# Patient Record
Sex: Male | Born: 1935 | Race: White | Hispanic: No | Marital: Married | Smoking: Former smoker
Health system: Southern US, Community
[De-identification: ages and names within clinical notes are randomized; demographics above are authoritative.]

## PROBLEM LIST (undated history)

## (undated) DIAGNOSIS — E785 Hyperlipidemia, unspecified: Secondary | ICD-10-CM

## (undated) DIAGNOSIS — I350 Nonrheumatic aortic (valve) stenosis: Secondary | ICD-10-CM

## (undated) DIAGNOSIS — Z8719 Personal history of other diseases of the digestive system: Secondary | ICD-10-CM

## (undated) DIAGNOSIS — R42 Dizziness and giddiness: Secondary | ICD-10-CM

## (undated) HISTORY — DX: Dizziness and giddiness: R42

## (undated) HISTORY — DX: Nonrheumatic aortic (valve) stenosis: I35.0

## (undated) HISTORY — DX: Hyperlipidemia, unspecified: E78.5

## (undated) HISTORY — DX: Personal history of other diseases of the digestive system: Z87.19

---

## 1998-12-17 ENCOUNTER — Ambulatory Visit (HOSPITAL_COMMUNITY): Admission: RE | Admit: 1998-12-17 | Discharge: 1998-12-17 | Payer: Self-pay | Admitting: *Deleted

## 2000-03-11 ENCOUNTER — Encounter: Payer: Self-pay | Admitting: *Deleted

## 2000-03-11 ENCOUNTER — Encounter: Admission: RE | Admit: 2000-03-11 | Discharge: 2000-03-11 | Payer: Self-pay | Admitting: *Deleted

## 2002-03-31 HISTORY — PX: CATARACT EXTRACTION: SUR2

## 2002-03-31 HISTORY — PX: COLONOSCOPY: SHX174

## 2003-01-25 ENCOUNTER — Ambulatory Visit (HOSPITAL_COMMUNITY): Admission: RE | Admit: 2003-01-25 | Discharge: 2003-01-25 | Payer: Self-pay | Admitting: *Deleted

## 2003-04-01 HISTORY — PX: APPENDECTOMY: SHX54

## 2003-11-10 ENCOUNTER — Inpatient Hospital Stay (HOSPITAL_COMMUNITY): Admission: EM | Admit: 2003-11-10 | Discharge: 2003-11-17 | Payer: Self-pay

## 2004-04-22 ENCOUNTER — Inpatient Hospital Stay (HOSPITAL_COMMUNITY): Admission: EM | Admit: 2004-04-22 | Discharge: 2004-04-24 | Payer: Self-pay | Admitting: Emergency Medicine

## 2005-04-18 ENCOUNTER — Encounter: Admission: RE | Admit: 2005-04-18 | Discharge: 2005-07-17 | Payer: Self-pay | Admitting: Family Medicine

## 2011-04-08 DIAGNOSIS — I10 Essential (primary) hypertension: Secondary | ICD-10-CM | POA: Diagnosis not present

## 2011-04-08 DIAGNOSIS — E119 Type 2 diabetes mellitus without complications: Secondary | ICD-10-CM | POA: Diagnosis not present

## 2011-07-15 DIAGNOSIS — H524 Presbyopia: Secondary | ICD-10-CM | POA: Diagnosis not present

## 2011-07-15 DIAGNOSIS — H52229 Regular astigmatism, unspecified eye: Secondary | ICD-10-CM | POA: Diagnosis not present

## 2011-07-15 DIAGNOSIS — H35379 Puckering of macula, unspecified eye: Secondary | ICD-10-CM | POA: Diagnosis not present

## 2011-07-15 DIAGNOSIS — H5200 Hypermetropia, unspecified eye: Secondary | ICD-10-CM | POA: Diagnosis not present

## 2011-08-24 ENCOUNTER — Encounter: Payer: Self-pay | Admitting: *Deleted

## 2011-10-07 ENCOUNTER — Telehealth: Payer: Self-pay | Admitting: Cardiology

## 2011-10-07 DIAGNOSIS — E7800 Pure hypercholesterolemia, unspecified: Secondary | ICD-10-CM | POA: Diagnosis not present

## 2011-10-07 DIAGNOSIS — G479 Sleep disorder, unspecified: Secondary | ICD-10-CM | POA: Diagnosis not present

## 2011-10-07 DIAGNOSIS — E119 Type 2 diabetes mellitus without complications: Secondary | ICD-10-CM | POA: Diagnosis not present

## 2011-10-07 DIAGNOSIS — I10 Essential (primary) hypertension: Secondary | ICD-10-CM | POA: Diagnosis not present

## 2011-10-07 DIAGNOSIS — R252 Cramp and spasm: Secondary | ICD-10-CM | POA: Diagnosis not present

## 2011-10-07 NOTE — Telephone Encounter (Signed)
Patient wanted to see  Dr. Patty Sermons prior to echo.  Scheduled a visit

## 2011-10-07 NOTE — Telephone Encounter (Signed)
Pt calling re fu appt, does he need echo first?

## 2011-10-10 ENCOUNTER — Encounter: Payer: Self-pay | Admitting: Cardiology

## 2011-10-10 ENCOUNTER — Ambulatory Visit (INDEPENDENT_AMBULATORY_CARE_PROVIDER_SITE_OTHER): Payer: Medicare Other | Admitting: Cardiology

## 2011-10-10 VITALS — BP 138/70 | HR 99 | Ht 63.5 in | Wt 150.0 lb

## 2011-10-10 DIAGNOSIS — I350 Nonrheumatic aortic (valve) stenosis: Secondary | ICD-10-CM | POA: Insufficient documentation

## 2011-10-10 DIAGNOSIS — R002 Palpitations: Secondary | ICD-10-CM | POA: Diagnosis not present

## 2011-10-10 DIAGNOSIS — I359 Nonrheumatic aortic valve disorder, unspecified: Secondary | ICD-10-CM | POA: Diagnosis not present

## 2011-10-10 NOTE — Assessment & Plan Note (Signed)
The patient has not been having any chest pain or shortness of breath.  No dizziness or syncope.  He still stays active mowing his grass and using a treadmill at home.  He has not been aware of any palpitations.

## 2011-10-10 NOTE — Patient Instructions (Signed)
Your physician has requested that you have an echocardiogram. Echocardiography is a painless test that uses sound waves to create images of your heart. It provides your doctor with information about the size and shape of your heart and how well your heart's chambers and valves are working. This procedure takes approximately one hour. There are no restrictions for this procedure.  Your physician recommends that you continue on your current medications as directed. Please refer to the Current Medication list given to you today.  Your physician wants you to follow-up in: 1 year. You will receive a reminder letter in the mail two months in advance. If you don't receive a letter, please call our office to schedule the follow-up appointment.  

## 2011-10-10 NOTE — Progress Notes (Signed)
Chad York Date of Birth:  Feb 09, 1928 [facility] [address] [place], [place]  [address] [phone]  Fax   [phone]  HPI: This pleasant 78 year old gentleman is seen for a scheduled followup office visit.  Normally we see him once a year but he missed last year.  He has a history of known valvular aortic stenosis.  His last echocardiogram was 07/28/08 and showed a peak instantaneous gradient of 26 and a mean gradient of 13 system with moderate calcific aortic stenosis. Current Outpatient Prescriptions  Medication Sig Dispense Refill  . amLODipine (NORVASC) 10 MG tablet Take 10 mg by mouth Daily.      Marland Kitchen atorvastatin (LIPITOR) 20 MG tablet Take 20 mg by mouth Daily.      . hydrochlorothiazide (HYDRODIURIL) 25 MG tablet Take 25 mg by mouth Daily.      . metFORMIN (GLUCOPHAGE) 500 MG tablet Take 500 mg by mouth Twice daily.      . Multiple Vitamin (MULTIVITAMIN) tablet Take 1 tablet by mouth daily.      . ramipril (ALTACE) 10 MG capsule Take 10 mg by mouth Twice daily.        Allergies  Allergen Reactions  . Septra (Sulfamethoxazole W-Trimethoprim)     rash    There is no problem list on file for this patient.   History  Smoking status  . Former Smoker  . Types: Cigarettes  . Quit date: 03/31/1985  Smokeless tobacco  . Not on file    History  Alcohol Use  . Yes    4-6 DRINKS WEEKLY     Family History  Problem Relation Age of Onset  . Heart disease Father   . Heart failure Father   . Heart disease Brother     CABG  . Stroke Brother   . Heart disease Brother     Review of Systems: The patient denies any heat or cold intolerance.  No weight gain or weight loss.  The patient denies headaches or blurry vision.  There is no cough or sputum production.  The patient denies dizziness.  There is no hematuria or hematochezia.  The patient denies any muscle aches or arthritis.  The patient denies any rash.  The patient denies frequent falling  or instability.  There is no history of depression or anxiety.  All other systems were reviewed and are negative.   Physical Exam: Filed Vitals:   10/10/11 1545  BP: 138/70  Pulse: 99   the general appearance reveals a well-developed elderly gentleman in no distress.Pupils equal and reactive.   Extraocular Movements are full.  There is no scleral icterus.  The mouth and pharynx are normal.  The neck is supple.  The carotids reveal no bruits.  The jugular venous pressure is normal.  The thyroid is not enlarged.  There is no lymphadenopathy.  The chest is clear to percussion and auscultation. There are no rales or rhonchi. Expansion of the chest is symmetrical.  Heart reveals grade 3/6 systolic ejection murmur at the base radiating to the neck.The abdomen is soft and nontender. Bowel sounds are normal. The liver and spleen are not enlarged. There Are no abdominal masses. There are no bruits.  The pedal pulses are good.  There is no phlebitis or edema.  There is no cyanosis or clubbing. Strength is normal and symmetrical in all extremities.  There is no lateralizing weakness.  There are no sensory deficits.  EKG today shows normal sinus rhythm at 99 per  minute and no ischemic changes and no changes of LVH   Assessment / Plan:  We will have the patient return soon for a two-dimensional echocardiogram to followup on his aortic stenosis. Recheck in one year for followup office visit.

## 2011-10-13 ENCOUNTER — Ambulatory Visit (HOSPITAL_COMMUNITY): Payer: Medicare Other | Attending: Cardiology

## 2011-10-13 DIAGNOSIS — R002 Palpitations: Secondary | ICD-10-CM | POA: Insufficient documentation

## 2011-10-13 DIAGNOSIS — I517 Cardiomegaly: Secondary | ICD-10-CM | POA: Diagnosis not present

## 2011-10-13 DIAGNOSIS — I359 Nonrheumatic aortic valve disorder, unspecified: Secondary | ICD-10-CM | POA: Insufficient documentation

## 2011-10-13 DIAGNOSIS — Z87891 Personal history of nicotine dependence: Secondary | ICD-10-CM | POA: Insufficient documentation

## 2011-10-13 DIAGNOSIS — I350 Nonrheumatic aortic (valve) stenosis: Secondary | ICD-10-CM

## 2011-10-13 NOTE — Progress Notes (Signed)
Echocardiogram performed.  

## 2011-10-22 ENCOUNTER — Telehealth: Payer: Self-pay | Admitting: *Deleted

## 2011-10-22 NOTE — Telephone Encounter (Signed)
Advised of results

## 2011-10-22 NOTE — Telephone Encounter (Signed)
Message copied by Burnell Blanks on Wed Oct 22, 2011  4:26 PM ------      Message from: Cassell Clement      Created: Mon Oct 20, 2011  9:27 AM       Please report.  The echocardiogram is stable.  The aortic stenosis is still mild.  Left ventricular function is good.  Send a copy to his primary care provider.

## 2011-11-17 DIAGNOSIS — K5900 Constipation, unspecified: Secondary | ICD-10-CM | POA: Diagnosis not present

## 2011-11-17 DIAGNOSIS — R1032 Left lower quadrant pain: Secondary | ICD-10-CM | POA: Diagnosis not present

## 2012-01-13 DIAGNOSIS — E11319 Type 2 diabetes mellitus with unspecified diabetic retinopathy without macular edema: Secondary | ICD-10-CM | POA: Diagnosis not present

## 2012-01-20 DIAGNOSIS — Z23 Encounter for immunization: Secondary | ICD-10-CM | POA: Diagnosis not present

## 2012-02-12 ENCOUNTER — Encounter: Payer: Self-pay | Admitting: Cardiology

## 2012-04-09 DIAGNOSIS — G479 Sleep disorder, unspecified: Secondary | ICD-10-CM | POA: Diagnosis not present

## 2012-04-09 DIAGNOSIS — K649 Unspecified hemorrhoids: Secondary | ICD-10-CM | POA: Diagnosis not present

## 2012-04-09 DIAGNOSIS — N400 Enlarged prostate without lower urinary tract symptoms: Secondary | ICD-10-CM | POA: Diagnosis not present

## 2012-04-09 DIAGNOSIS — E119 Type 2 diabetes mellitus without complications: Secondary | ICD-10-CM | POA: Diagnosis not present

## 2012-05-20 ENCOUNTER — Encounter: Payer: Self-pay | Admitting: Cardiology

## 2012-07-06 DIAGNOSIS — E1139 Type 2 diabetes mellitus with other diabetic ophthalmic complication: Secondary | ICD-10-CM | POA: Diagnosis not present

## 2012-07-06 DIAGNOSIS — H35379 Puckering of macula, unspecified eye: Secondary | ICD-10-CM | POA: Diagnosis not present

## 2012-08-25 DIAGNOSIS — R05 Cough: Secondary | ICD-10-CM | POA: Diagnosis not present

## 2012-10-12 DIAGNOSIS — Z23 Encounter for immunization: Secondary | ICD-10-CM | POA: Diagnosis not present

## 2012-10-12 DIAGNOSIS — E119 Type 2 diabetes mellitus without complications: Secondary | ICD-10-CM | POA: Diagnosis not present

## 2012-10-12 DIAGNOSIS — E7800 Pure hypercholesterolemia, unspecified: Secondary | ICD-10-CM | POA: Diagnosis not present

## 2012-10-12 DIAGNOSIS — G479 Sleep disorder, unspecified: Secondary | ICD-10-CM | POA: Diagnosis not present

## 2012-10-12 DIAGNOSIS — Z0000 Encounter for general adult medical examination without abnormal findings: Secondary | ICD-10-CM | POA: Diagnosis not present

## 2012-10-12 DIAGNOSIS — Z79899 Other long term (current) drug therapy: Secondary | ICD-10-CM | POA: Diagnosis not present

## 2012-10-12 DIAGNOSIS — I10 Essential (primary) hypertension: Secondary | ICD-10-CM | POA: Diagnosis not present

## 2012-10-28 ENCOUNTER — Telehealth: Payer: Self-pay | Admitting: Cardiology

## 2012-10-28 DIAGNOSIS — I350 Nonrheumatic aortic (valve) stenosis: Secondary | ICD-10-CM

## 2012-10-28 NOTE — Telephone Encounter (Signed)
New problem   Pt called to schedule appt-scheduled pt for 12/01/12 but pt cxld and stated he does not need an ov-pt stated he needs an echo only

## 2012-10-28 NOTE — Telephone Encounter (Signed)
Scheduled 1 year ov and echo for patient

## 2012-10-28 NOTE — Telephone Encounter (Signed)
Okay 

## 2012-12-01 ENCOUNTER — Ambulatory Visit: Payer: Medicare Other | Admitting: Cardiology

## 2012-12-01 ENCOUNTER — Other Ambulatory Visit (HOSPITAL_COMMUNITY): Payer: Medicare Other

## 2012-12-03 ENCOUNTER — Other Ambulatory Visit (HOSPITAL_COMMUNITY): Payer: Medicare Other

## 2012-12-03 ENCOUNTER — Ambulatory Visit: Payer: Medicare Other | Admitting: Cardiology

## 2012-12-08 ENCOUNTER — Ambulatory Visit (INDEPENDENT_AMBULATORY_CARE_PROVIDER_SITE_OTHER): Payer: Medicare Other | Admitting: Cardiology

## 2012-12-08 ENCOUNTER — Ambulatory Visit (HOSPITAL_COMMUNITY): Payer: Medicare Other | Attending: Cardiology

## 2012-12-08 ENCOUNTER — Encounter: Payer: Self-pay | Admitting: Cardiology

## 2012-12-08 VITALS — BP 160/68 | HR 86 | Ht 64.0 in | Wt 142.0 lb

## 2012-12-08 DIAGNOSIS — R002 Palpitations: Secondary | ICD-10-CM | POA: Diagnosis not present

## 2012-12-08 DIAGNOSIS — I10 Essential (primary) hypertension: Secondary | ICD-10-CM | POA: Diagnosis not present

## 2012-12-08 DIAGNOSIS — I350 Nonrheumatic aortic (valve) stenosis: Secondary | ICD-10-CM

## 2012-12-08 DIAGNOSIS — I359 Nonrheumatic aortic valve disorder, unspecified: Secondary | ICD-10-CM | POA: Diagnosis not present

## 2012-12-08 NOTE — Progress Notes (Signed)
Echocardiogram performed.  

## 2012-12-08 NOTE — Patient Instructions (Addendum)
Your physician recommends that you continue on your current medications as directed. Please refer to the Current Medication list given to you today.  Your physician wants you to follow-up in: 1 YEAR OV/EKG  You will receive a reminder letter in the mail two months in advance. If you don't receive a letter, please call our office to schedule the follow-up appointment.  

## 2012-12-08 NOTE — Progress Notes (Signed)
Chad York Date of Birth:  Dec 03, 1927 [facility] [address] [place], [place]  [address] [phone]  Fax   [phone]  HPI: This pleasant 79 year old gentleman is seen for a one-year followup office visit.  He has a history of mild aortic stenosis.  His valvular lesion has remained stable over the past several years.  Last year his peak gradient was 21 and his mean gradient was 12.  Patient had an updated echocardiogram on 12/08/12 showing a peak gradient of 24 and a mean gradient of 13, essentially no change.  The patient has normal left ventricular function with ejection fraction of 55-65%.. The patient has remained physically active.  He is not having any chest pain or shortness of breath.  He works out on his treadmill at home for about 25 minutes.  He would walk on it for a longer time except that his left hip starts to bother him.  He also still stays busy with doing yard work outside.   Current Outpatient Prescriptions  Medication Sig Dispense Refill  . amLODipine (NORVASC) 10 MG tablet Take 10 mg by mouth Daily.      Marland Kitchen atorvastatin (LIPITOR) 20 MG tablet Take 20 mg by mouth Daily.      . metFORMIN (GLUCOPHAGE) 500 MG tablet Take 500 mg by mouth Twice daily.      . Multiple Vitamin (MULTIVITAMIN) tablet Take 1 tablet by mouth daily.      . ramipril (ALTACE) 10 MG capsule Take 10 mg by mouth Twice daily.      Marland Kitchen zolpidem (AMBIEN) 10 MG tablet        No current facility-administered medications for this visit.    Allergies  Allergen Reactions  . Septra [Sulfamethoxazole W-Trimethoprim]     rash    Patient Active Problem List   Diagnosis Date Noted  . Aortic stenosis, mild 10/10/2011    History  Smoking status  . Former Smoker  . Types: Cigarettes  . Quit date: 03/31/1985  Smokeless tobacco  . Not on file    History  Alcohol Use  . Yes    Comment: 4-6 DRINKS WEEKLY     Family History  Problem Relation Age of Onset  . Heart  disease Father   . Heart failure Father   . Heart disease Brother     CABG  . Stroke Brother   . Heart disease Brother     Review of Systems: The patient denies any heat or cold intolerance.  No weight gain or weight loss.  The patient denies headaches or blurry vision.  There is no cough or sputum production.  The patient denies dizziness.  There is no hematuria or hematochezia.  The patient denies any muscle aches or arthritis.  The patient denies any rash.  The patient denies frequent falling or instability.  There is no history of depression or anxiety.  All other systems were reviewed and are negative.   Physical Exam: Filed Vitals:   12/08/12 1528  BP: 160/68  Pulse: 86   repeat blood pressure by me was 140/70 right arm sitting. The general appearance reveals a well-developed well-nourished elderly gentleman in no distress.The head and neck exam reveals pupils equal and reactive.  Extraocular movements are full.  There is no scleral icterus.  The mouth and pharynx are normal.  The neck is supple.  The carotids reveal no bruits.  The jugular venous pressure is normal.  The  thyroid is not enlarged.  There is  no lymphadenopathy.  The chest is clear to percussion and auscultation.  There are no rales or rhonchi.  Expansion of the chest is symmetrical.  The precordium is quiet.  The first heart sound is normal.  The second heart sound is physiologically split.  There is grade 3/6 harsh systolic ejection murmur at the base radiating toward the neck.  No diastolic murmur.  There is no abnormal lift or heave.  The abdomen is soft and nontender.  The bowel sounds are normal.  The liver and spleen are not enlarged.  There are no abdominal masses.  There are no abdominal bruits.  Extremities reveal good pedal pulses.  There is no phlebitis or edema.  There is no cyanosis or clubbing.  Strength is normal and symmetrical in all extremities.  There is no lateralizing weakness.  There are no sensory  deficits.  The skin is warm and dry.  There is no rash.  EKG shows normal sinus rhythm with occasional PACs.  There is no LVH or strain pattern.   Assessment / Plan: The patient continues to have mild valvular aortic stenosis which is asymptomatic. The patient is to continue his current medications.  Recheck in one year for office visit and EKG.  We will not need to do an echocardiogram next year unless symptoms change significantly.

## 2013-01-11 DIAGNOSIS — E11329 Type 2 diabetes mellitus with mild nonproliferative diabetic retinopathy without macular edema: Secondary | ICD-10-CM | POA: Diagnosis not present

## 2013-01-11 DIAGNOSIS — H35379 Puckering of macula, unspecified eye: Secondary | ICD-10-CM | POA: Diagnosis not present

## 2013-01-24 DIAGNOSIS — Z23 Encounter for immunization: Secondary | ICD-10-CM | POA: Diagnosis not present

## 2013-02-03 ENCOUNTER — Other Ambulatory Visit: Payer: Self-pay

## 2013-04-14 DIAGNOSIS — E119 Type 2 diabetes mellitus without complications: Secondary | ICD-10-CM | POA: Diagnosis not present

## 2013-04-14 DIAGNOSIS — G479 Sleep disorder, unspecified: Secondary | ICD-10-CM | POA: Diagnosis not present

## 2013-06-15 DIAGNOSIS — R059 Cough, unspecified: Secondary | ICD-10-CM | POA: Diagnosis not present

## 2013-06-15 DIAGNOSIS — R05 Cough: Secondary | ICD-10-CM | POA: Diagnosis not present

## 2013-07-12 DIAGNOSIS — E119 Type 2 diabetes mellitus without complications: Secondary | ICD-10-CM | POA: Diagnosis not present

## 2013-07-12 DIAGNOSIS — H35379 Puckering of macula, unspecified eye: Secondary | ICD-10-CM | POA: Diagnosis not present

## 2013-07-27 DIAGNOSIS — H35379 Puckering of macula, unspecified eye: Secondary | ICD-10-CM | POA: Diagnosis not present

## 2013-07-27 DIAGNOSIS — H43819 Vitreous degeneration, unspecified eye: Secondary | ICD-10-CM | POA: Diagnosis not present

## 2013-07-27 DIAGNOSIS — H353190 Nonexudative age-related macular degeneration, unspecified eye, stage unspecified: Secondary | ICD-10-CM | POA: Diagnosis not present

## 2013-08-31 DIAGNOSIS — H35379 Puckering of macula, unspecified eye: Secondary | ICD-10-CM | POA: Diagnosis not present

## 2013-10-12 DIAGNOSIS — E7800 Pure hypercholesterolemia, unspecified: Secondary | ICD-10-CM | POA: Diagnosis not present

## 2013-10-12 DIAGNOSIS — I10 Essential (primary) hypertension: Secondary | ICD-10-CM | POA: Diagnosis not present

## 2013-10-12 DIAGNOSIS — E119 Type 2 diabetes mellitus without complications: Secondary | ICD-10-CM | POA: Diagnosis not present

## 2013-10-12 DIAGNOSIS — G479 Sleep disorder, unspecified: Secondary | ICD-10-CM | POA: Diagnosis not present

## 2013-10-12 DIAGNOSIS — Z23 Encounter for immunization: Secondary | ICD-10-CM | POA: Diagnosis not present

## 2013-10-18 DIAGNOSIS — E7800 Pure hypercholesterolemia, unspecified: Secondary | ICD-10-CM | POA: Diagnosis not present

## 2013-10-18 DIAGNOSIS — I10 Essential (primary) hypertension: Secondary | ICD-10-CM | POA: Diagnosis not present

## 2013-10-18 DIAGNOSIS — E119 Type 2 diabetes mellitus without complications: Secondary | ICD-10-CM | POA: Diagnosis not present

## 2013-10-18 DIAGNOSIS — G479 Sleep disorder, unspecified: Secondary | ICD-10-CM | POA: Diagnosis not present

## 2013-10-18 DIAGNOSIS — Z23 Encounter for immunization: Secondary | ICD-10-CM | POA: Diagnosis not present

## 2013-11-30 DIAGNOSIS — H3581 Retinal edema: Secondary | ICD-10-CM | POA: Diagnosis not present

## 2013-11-30 DIAGNOSIS — H35379 Puckering of macula, unspecified eye: Secondary | ICD-10-CM | POA: Diagnosis not present

## 2013-12-12 ENCOUNTER — Encounter: Payer: Self-pay | Admitting: Cardiology

## 2013-12-12 ENCOUNTER — Ambulatory Visit (INDEPENDENT_AMBULATORY_CARE_PROVIDER_SITE_OTHER): Payer: Medicare Other | Admitting: Cardiology

## 2013-12-12 VITALS — BP 150/81 | HR 83 | Ht 64.0 in | Wt 141.0 lb

## 2013-12-12 DIAGNOSIS — I350 Nonrheumatic aortic (valve) stenosis: Secondary | ICD-10-CM

## 2013-12-12 DIAGNOSIS — I359 Nonrheumatic aortic valve disorder, unspecified: Secondary | ICD-10-CM | POA: Diagnosis not present

## 2013-12-12 DIAGNOSIS — I119 Hypertensive heart disease without heart failure: Secondary | ICD-10-CM | POA: Diagnosis not present

## 2013-12-12 NOTE — Patient Instructions (Signed)
Your physician recommends that you continue on your current medications as directed. Please refer to the Current Medication list given to you today.  Your physician wants you to follow-up in: 1 year ov/ekg You will receive a reminder letter in the mail two months in advance. If you don't receive a letter, please call our office to schedule the follow-up appointment.  

## 2013-12-12 NOTE — Assessment & Plan Note (Signed)
The patient has not had any symptoms from his mild aortic stenosis.  No exertional chest pain.  No dizziness or syncope.  No CHF symptoms.

## 2013-12-12 NOTE — Assessment & Plan Note (Signed)
Blood pressure is elevated.  He will go back on his former dose of amlodipine 10 mg daily.

## 2013-12-12 NOTE — Progress Notes (Signed)
Norva Pavlov Date of Birth:  12/20/1927 [facility] [address] [place], [place]  [address] [phone]  Fax   [phone]  HPI: This pleasant 80 year old gentleman is seen for a one-year followup office visit. He has a history of mild aortic stenosis. His valvular lesion has remained stable over the past several years. Last year his peak gradient was 21 and his mean gradient was 12. Patient had an updated echocardiogram on 12/08/12 showing a peak gradient of 24 and a mean gradient of 13, essentially no change. The patient has normal left ventricular function with ejection fraction of 55-65%..  The patient has remained physically active. He is not having any chest pain or shortness of breath. He works out on his treadmill at home for about 30 minutes.  He has a history of essential hypertension.  He had tried cutting back on his dose of amlodipine.  However his blood pressure is currently too high and he will go back on his previous dose.  He has not been having any peripheral edema or dizziness or syncope.   Current Outpatient Prescriptions  Medication Sig Dispense Refill  . amLODipine (NORVASC) 10 MG tablet Take 10 mg by mouth Daily.      Marland Kitchen atorvastatin (LIPITOR) 20 MG tablet Take 20 mg by mouth Daily.      . metFORMIN (GLUCOPHAGE) 500 MG tablet Take 500 mg by mouth Twice daily.      . Multiple Vitamin (MULTIVITAMIN) tablet Take 1 tablet by mouth daily.      . ramipril (ALTACE) 10 MG capsule Take 10 mg by mouth Twice daily.      Marland Kitchen zolpidem (AMBIEN) 10 MG tablet 10 mg. Take 1/2 tablet five time a week       No current facility-administered medications for this visit.    Allergies  Allergen Reactions  . Septra [Sulfamethoxazole-Trimethoprim]     rash    Patient Active Problem List   Diagnosis Date Noted  . Benign hypertensive heart disease without heart failure 12/12/2013  . Aortic stenosis, mild 10/10/2011    History  Smoking status  . Former  Smoker  . Types: Cigarettes  . Quit date: 03/31/1985  Smokeless tobacco  . Not on file    History  Alcohol Use  . Yes    Comment: 4-6 DRINKS WEEKLY     Family History  Problem Relation Age of Onset  . Heart disease Father   . Heart failure Father   . Heart disease Brother     CABG  . Stroke Brother   . Heart disease Brother     Review of Systems: The patient denies any heat or cold intolerance.  No weight gain or weight loss.  The patient denies headaches or blurry vision.  There is no cough or sputum production.  The patient denies dizziness.  There is no hematuria or hematochezia.  The patient denies any muscle aches or arthritis.  The patient denies any rash.  The patient denies frequent falling or instability.  There is no history of depression or anxiety.  All other systems were reviewed and are negative.   Physical Exam: Filed Vitals:   12/12/13 1422  BP: 150/81  Pulse: 83   general appearance reveals a well-developed elderly gentleman in no distress. The patient appears to be in no distress.  Head and neck exam reveals that the pupils are equal and reactive.  The extraocular movements are full.  There is no scleral icterus.  Mouth and pharynx are benign.  No lymphadenopathy.  No carotid bruits.  The jugular venous pressure is normal.  Thyroid is not enlarged or tender.  Chest is clear to percussion and auscultation.  No rales or rhonchi.  Expansion of the chest is symmetrical.  Heart reveals no abnormal lift or heave.  First and second heart sounds are normal.  There is grade 2/6 systolic ejection murmur at the base.  No diastolic murmur.  The abdomen is soft and nontender.  Bowel sounds are normoactive.  There is no hepatosplenomegaly or mass.  There are no abdominal bruits.  Extremities reveal no phlebitis or edema.  Pedal pulses are good.  There is no cyanosis or clubbing.  Neurologic exam is normal strength and no lateralizing weakness.  No sensory  deficits.  Integument reveals no rash  EKG shows sinus rhythm with marked sinus arrhythmia and occasional PAC.  No ischemic changes.  Assessment / Plan: 1. mild aortic stenosis 2. essential hypertension without heart failure 3. Hyperlipidemia 4. diabetes mellitus adult onset  Plan: Continue current medication.  Increase amlodipine back to its original dose of 10 mg daily. Recheck in one year for office visit and EKG.  Consider echocardiogram after next year's visit.

## 2013-12-28 DIAGNOSIS — M1710 Unilateral primary osteoarthritis, unspecified knee: Secondary | ICD-10-CM | POA: Diagnosis not present

## 2013-12-28 DIAGNOSIS — M25569 Pain in unspecified knee: Secondary | ICD-10-CM | POA: Diagnosis not present

## 2013-12-28 DIAGNOSIS — M76899 Other specified enthesopathies of unspecified lower limb, excluding foot: Secondary | ICD-10-CM | POA: Diagnosis not present

## 2014-01-03 DIAGNOSIS — Z23 Encounter for immunization: Secondary | ICD-10-CM | POA: Diagnosis not present

## 2014-01-11 DIAGNOSIS — M25561 Pain in right knee: Secondary | ICD-10-CM | POA: Diagnosis not present

## 2014-01-11 DIAGNOSIS — M25761 Osteophyte, right knee: Secondary | ICD-10-CM | POA: Diagnosis not present

## 2014-01-12 DIAGNOSIS — H905 Unspecified sensorineural hearing loss: Secondary | ICD-10-CM | POA: Diagnosis not present

## 2014-03-08 DIAGNOSIS — H3532 Exudative age-related macular degeneration: Secondary | ICD-10-CM | POA: Diagnosis not present

## 2014-03-08 DIAGNOSIS — H35371 Puckering of macula, right eye: Secondary | ICD-10-CM | POA: Diagnosis not present

## 2014-03-08 DIAGNOSIS — H3581 Retinal edema: Secondary | ICD-10-CM | POA: Diagnosis not present

## 2014-03-17 DIAGNOSIS — H3532 Exudative age-related macular degeneration: Secondary | ICD-10-CM | POA: Diagnosis not present

## 2014-04-14 DIAGNOSIS — H43813 Vitreous degeneration, bilateral: Secondary | ICD-10-CM | POA: Diagnosis not present

## 2014-04-14 DIAGNOSIS — H3532 Exudative age-related macular degeneration: Secondary | ICD-10-CM | POA: Diagnosis not present

## 2014-04-14 DIAGNOSIS — H3581 Retinal edema: Secondary | ICD-10-CM | POA: Diagnosis not present

## 2014-04-17 DIAGNOSIS — E780 Pure hypercholesterolemia: Secondary | ICD-10-CM | POA: Diagnosis not present

## 2014-04-17 DIAGNOSIS — M549 Dorsalgia, unspecified: Secondary | ICD-10-CM | POA: Diagnosis not present

## 2014-04-17 DIAGNOSIS — E119 Type 2 diabetes mellitus without complications: Secondary | ICD-10-CM | POA: Diagnosis not present

## 2014-04-17 DIAGNOSIS — Z0000 Encounter for general adult medical examination without abnormal findings: Secondary | ICD-10-CM | POA: Diagnosis not present

## 2014-04-17 DIAGNOSIS — G479 Sleep disorder, unspecified: Secondary | ICD-10-CM | POA: Diagnosis not present

## 2014-04-17 DIAGNOSIS — I10 Essential (primary) hypertension: Secondary | ICD-10-CM | POA: Diagnosis not present

## 2014-04-18 DIAGNOSIS — M549 Dorsalgia, unspecified: Secondary | ICD-10-CM | POA: Diagnosis not present

## 2014-04-18 DIAGNOSIS — I10 Essential (primary) hypertension: Secondary | ICD-10-CM | POA: Diagnosis not present

## 2014-04-18 DIAGNOSIS — E119 Type 2 diabetes mellitus without complications: Secondary | ICD-10-CM | POA: Diagnosis not present

## 2014-04-18 DIAGNOSIS — E780 Pure hypercholesterolemia: Secondary | ICD-10-CM | POA: Diagnosis not present

## 2014-04-18 DIAGNOSIS — Z0000 Encounter for general adult medical examination without abnormal findings: Secondary | ICD-10-CM | POA: Diagnosis not present

## 2014-04-18 DIAGNOSIS — G479 Sleep disorder, unspecified: Secondary | ICD-10-CM | POA: Diagnosis not present

## 2014-05-12 DIAGNOSIS — H3532 Exudative age-related macular degeneration: Secondary | ICD-10-CM | POA: Diagnosis not present

## 2014-05-19 DIAGNOSIS — H905 Unspecified sensorineural hearing loss: Secondary | ICD-10-CM | POA: Diagnosis not present

## 2014-06-12 DIAGNOSIS — H3532 Exudative age-related macular degeneration: Secondary | ICD-10-CM | POA: Diagnosis not present

## 2014-06-15 DIAGNOSIS — J069 Acute upper respiratory infection, unspecified: Secondary | ICD-10-CM | POA: Diagnosis not present

## 2014-07-10 DIAGNOSIS — H3532 Exudative age-related macular degeneration: Secondary | ICD-10-CM | POA: Diagnosis not present

## 2014-07-10 DIAGNOSIS — H35371 Puckering of macula, right eye: Secondary | ICD-10-CM | POA: Diagnosis not present

## 2014-07-10 DIAGNOSIS — H43813 Vitreous degeneration, bilateral: Secondary | ICD-10-CM | POA: Diagnosis not present

## 2014-09-12 DIAGNOSIS — H3532 Exudative age-related macular degeneration: Secondary | ICD-10-CM | POA: Diagnosis not present

## 2014-09-12 DIAGNOSIS — H35371 Puckering of macula, right eye: Secondary | ICD-10-CM | POA: Diagnosis not present

## 2014-09-12 DIAGNOSIS — H3581 Retinal edema: Secondary | ICD-10-CM | POA: Diagnosis not present

## 2014-09-12 DIAGNOSIS — H43813 Vitreous degeneration, bilateral: Secondary | ICD-10-CM | POA: Diagnosis not present

## 2014-09-25 ENCOUNTER — Other Ambulatory Visit: Payer: Self-pay

## 2014-10-17 DIAGNOSIS — I10 Essential (primary) hypertension: Secondary | ICD-10-CM | POA: Diagnosis not present

## 2014-10-17 DIAGNOSIS — M25569 Pain in unspecified knee: Secondary | ICD-10-CM | POA: Diagnosis not present

## 2014-10-17 DIAGNOSIS — E119 Type 2 diabetes mellitus without complications: Secondary | ICD-10-CM | POA: Diagnosis not present

## 2014-10-17 DIAGNOSIS — G479 Sleep disorder, unspecified: Secondary | ICD-10-CM | POA: Diagnosis not present

## 2014-10-24 DIAGNOSIS — H43813 Vitreous degeneration, bilateral: Secondary | ICD-10-CM | POA: Diagnosis not present

## 2014-10-24 DIAGNOSIS — H3532 Exudative age-related macular degeneration: Secondary | ICD-10-CM | POA: Diagnosis not present

## 2014-10-24 DIAGNOSIS — H3581 Retinal edema: Secondary | ICD-10-CM | POA: Diagnosis not present

## 2014-10-24 DIAGNOSIS — H35373 Puckering of macula, bilateral: Secondary | ICD-10-CM | POA: Diagnosis not present

## 2014-12-05 DIAGNOSIS — H3532 Exudative age-related macular degeneration: Secondary | ICD-10-CM | POA: Diagnosis not present

## 2014-12-05 DIAGNOSIS — H3581 Retinal edema: Secondary | ICD-10-CM | POA: Diagnosis not present

## 2014-12-05 DIAGNOSIS — H35373 Puckering of macula, bilateral: Secondary | ICD-10-CM | POA: Diagnosis not present

## 2014-12-05 DIAGNOSIS — H43813 Vitreous degeneration, bilateral: Secondary | ICD-10-CM | POA: Diagnosis not present

## 2015-01-01 ENCOUNTER — Ambulatory Visit (INDEPENDENT_AMBULATORY_CARE_PROVIDER_SITE_OTHER): Payer: Medicare Other | Admitting: Cardiology

## 2015-01-01 ENCOUNTER — Encounter: Payer: Self-pay | Admitting: Cardiology

## 2015-01-01 VITALS — BP 144/66 | HR 92 | Ht 63.0 in | Wt 138.4 lb

## 2015-01-01 DIAGNOSIS — I491 Atrial premature depolarization: Secondary | ICD-10-CM | POA: Diagnosis not present

## 2015-01-01 DIAGNOSIS — I350 Nonrheumatic aortic (valve) stenosis: Secondary | ICD-10-CM | POA: Diagnosis not present

## 2015-01-01 DIAGNOSIS — I119 Hypertensive heart disease without heart failure: Secondary | ICD-10-CM | POA: Diagnosis not present

## 2015-01-01 NOTE — Patient Instructions (Signed)
Medication Instructions:  Your physician recommends that you continue on your current medications as directed. Please refer to the Current Medication list given to you today.  Labwork: none  Testing/Procedures:. Your physician has requested that you have an echocardiogram. Echocardiography is a painless test that uses sound waves to create images of your heart. It provides your doctor with information about the size and shape of your heart and how well your heart's chambers and valves are working. This procedure takes approximately one hour. There are no restrictions for this procedure.  Follow-Up: Your physician wants you to follow-up in: 1 year ov  You will receive a reminder letter in the mail two months in advance. If you don't receive a letter, please call our office to schedule the follow-up appointment.

## 2015-01-01 NOTE — Progress Notes (Signed)
Cardiology Office Note   Date:  01/01/2015   ID:  Chad York, DOB March 17, 1928, MRN [id number]  PCP:   Duane Lope, MD  Cardiologist: Cassell Clement MD  No chief complaint on file.     History of Present Illness: Chad York is a 81 y.o. male who presents for a scheduled follow-up office visit  This pleasant 81 year old gentleman is seen for a one-year followup office visit. He has a history of mild aortic stenosis. His valvular lesion has remained stable over the past several years. Patient had an updated echocardiogram on 12/08/12 showing a peak gradient of 24 and a mean gradient of 13, essentially no change. The patient has normal left ventricular function with ejection fraction of 55-65%..  The patient has remained physically active. He is not having any chest pain or shortness of breath. He works out on his treadmill at home for about 30 minutes. He has a history of essential hypertension.  He has not been having any peripheral edema or dizziness or syncope. His blood pressure here in the office is slightly elevated.  He checks his blood pressure at home and it is always normal.  He admits to having whitecoat syndrome. His PCP Dr. Tenny Craw has been checking his cholesterols. The patient is a mild diabetic.  His metformin was recently reduced to just once a day.  A repeat A1c is anticipated at Dr. Charlott Rakes office soon.  Past Medical History  Diagnosis Date  . Aortic stenosis   . Diabetes mellitus   . Hyperlipidemia   . Dizzy spells     with near syncope at times  . H/O small bowel obstruction     RESOLVED W/O SURGERY    Past Surgical History  Procedure Laterality Date  . Appendectomy  2005    COMPLICATED BY ABSCESS FORMATION  . Cataract extraction  2004    LEFT EYE  . Colonoscopy  2004     Current Outpatient Prescriptions  Medication Sig Dispense Refill  . amLODipine (NORVASC) 10 MG tablet Take 10 mg by mouth Daily.    Marland Kitchen atorvastatin (LIPITOR) 20 MG tablet  Take 20 mg by mouth Daily.    . metFORMIN (GLUCOPHAGE) 500 MG tablet Take 500 mg by mouth daily with breakfast.     . Multiple Vitamin (MULTIVITAMIN) tablet Take 1 tablet by mouth daily.    . ramipril (ALTACE) 10 MG capsule Take 10 mg by mouth Twice daily.    Marland Kitchen tobramycin (TOBREX) 0.3 % ophthalmic solution Place 1 drop into both eyes 2 (two) times daily.  4  . zolpidem (AMBIEN) 10 MG tablet Take 10 mg by mouth. Take 1/2 tablet by mouth five times a week     No current facility-administered medications for this visit.    Allergies:   Septra and Ciprofloxacin    Social History:  The patient  reports that he quit smoking about 29 years ago. His smoking use included Cigarettes. He does not have any smokeless tobacco history on file. He reports that he drinks alcohol. He reports that he does not use illicit drugs.   Family History:  The patient's family history includes Heart disease in his brother, brother, and father; Heart failure in his father; Stroke in his brother.    ROS:  Please see the history of present illness.   Otherwise, review of systems are positive for none.   All other systems are reviewed and negative.    PHYSICAL EXAM: VS:  BP 144/66 mmHg  Pulse 92  Ht  (1.6 m)  Wt 138 lb 6.4 oz (62.778 kg)  BMI 24.52 kg/m2 , BMI Body mass index is 24.52 kg/(m^2). GEN: Well nourished, well developed, in no acute distress HEENT: normal Neck: no JVD, carotid bruits, or masses.  The carotid upstroke is normal. Cardiac: RRR; there is a grade 3/6 systolic ejection murmur at the base.  There is no rubs, or gallops,no edema  Respiratory:  clear to auscultation bilaterally, normal work of breathing GI: soft, nontender, nondistended, + BS MS: no deformity or atrophy Skin: warm and dry, no rash Neuro:  Strength and sensation are intact Psych: euthymic mood, full affect   EKG:  EKG is ordered today. The ekg ordered today demonstrates normal sinus rhythm with occasional premature  atrial complexes.   Recent Labs: No results found for requested labs within last 365 days.    Lipid Panel No results found for: CHOL, TRIG, HDL, CHOLHDL, VLDL, LDLCALC, LDLDIRECT    Wt Readings from Last 3 Encounters:  01/01/15 138 lb 6.4 oz (62.778 kg)  12/12/13 141 lb (63.957 kg)  12/08/12 142 lb (64.411 kg)         ASSESSMENT AND PLAN:  1. mild aortic stenosis 2. essential hypertension without heart failure 3. Hyperlipidemia 4. diabetes mellitus adult onset 5.  Premature atrial complexes.  These are asymptomatic.  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  Plan: Continue current medication. Continue amlodipine  dose of 10 mg daily. Recheck in one year for office visit and EKG.  We will have him return soon for a follow-up 2-D echo to follow his aortic stenosis. .   Current medicines are reviewed at length with the patient today.  The patient does not have concerns regarding medicines.  The following changes have been made:  no change  Labs/ tests ordered today include:   Orders Placed This Encounter  Procedures  . EKG 12-Lead  . ECHOCARDIOGRAM COMPLETE    Disposition: Continue current medication.  Update 2-D echo.  Recheck in one year for office visit and EKG.  Karie Schwalbe MD 01/01/2015 9:33 AM    [facility] [address] [place], [place], [place]  [address] Phone: [phone]; Fax: [phone]

## 2015-01-03 ENCOUNTER — Other Ambulatory Visit: Payer: Self-pay

## 2015-01-03 ENCOUNTER — Ambulatory Visit (HOSPITAL_COMMUNITY): Payer: Medicare Other | Attending: Cardiology

## 2015-01-03 DIAGNOSIS — E785 Hyperlipidemia, unspecified: Secondary | ICD-10-CM | POA: Insufficient documentation

## 2015-01-03 DIAGNOSIS — E119 Type 2 diabetes mellitus without complications: Secondary | ICD-10-CM | POA: Diagnosis not present

## 2015-01-03 DIAGNOSIS — I350 Nonrheumatic aortic (valve) stenosis: Secondary | ICD-10-CM | POA: Insufficient documentation

## 2015-01-03 DIAGNOSIS — I059 Rheumatic mitral valve disease, unspecified: Secondary | ICD-10-CM | POA: Diagnosis not present

## 2015-01-03 DIAGNOSIS — I340 Nonrheumatic mitral (valve) insufficiency: Secondary | ICD-10-CM | POA: Diagnosis not present

## 2015-01-11 DIAGNOSIS — Z23 Encounter for immunization: Secondary | ICD-10-CM | POA: Diagnosis not present

## 2015-01-16 DIAGNOSIS — H353231 Exudative age-related macular degeneration, bilateral, with active choroidal neovascularization: Secondary | ICD-10-CM | POA: Diagnosis not present

## 2015-01-16 DIAGNOSIS — H43813 Vitreous degeneration, bilateral: Secondary | ICD-10-CM | POA: Diagnosis not present

## 2015-01-16 DIAGNOSIS — H35373 Puckering of macula, bilateral: Secondary | ICD-10-CM | POA: Diagnosis not present

## 2015-01-31 DIAGNOSIS — H903 Sensorineural hearing loss, bilateral: Secondary | ICD-10-CM | POA: Diagnosis not present

## 2015-02-27 DIAGNOSIS — H3581 Retinal edema: Secondary | ICD-10-CM | POA: Diagnosis not present

## 2015-02-27 DIAGNOSIS — H353231 Exudative age-related macular degeneration, bilateral, with active choroidal neovascularization: Secondary | ICD-10-CM | POA: Diagnosis not present

## 2015-02-27 DIAGNOSIS — H35373 Puckering of macula, bilateral: Secondary | ICD-10-CM | POA: Diagnosis not present

## 2015-02-27 DIAGNOSIS — H43813 Vitreous degeneration, bilateral: Secondary | ICD-10-CM | POA: Diagnosis not present

## 2015-04-11 DIAGNOSIS — H35373 Puckering of macula, bilateral: Secondary | ICD-10-CM | POA: Diagnosis not present

## 2015-04-11 DIAGNOSIS — H3581 Retinal edema: Secondary | ICD-10-CM | POA: Diagnosis not present

## 2015-04-11 DIAGNOSIS — H43813 Vitreous degeneration, bilateral: Secondary | ICD-10-CM | POA: Diagnosis not present

## 2015-04-11 DIAGNOSIS — H353231 Exudative age-related macular degeneration, bilateral, with active choroidal neovascularization: Secondary | ICD-10-CM | POA: Diagnosis not present

## 2015-05-03 DIAGNOSIS — E119 Type 2 diabetes mellitus without complications: Secondary | ICD-10-CM | POA: Diagnosis not present

## 2015-05-03 DIAGNOSIS — Z0000 Encounter for general adult medical examination without abnormal findings: Secondary | ICD-10-CM | POA: Diagnosis not present

## 2015-05-03 DIAGNOSIS — Z7984 Long term (current) use of oral hypoglycemic drugs: Secondary | ICD-10-CM | POA: Diagnosis not present

## 2015-05-03 DIAGNOSIS — E7800 Pure hypercholesterolemia, unspecified: Secondary | ICD-10-CM | POA: Diagnosis not present

## 2015-05-03 DIAGNOSIS — I10 Essential (primary) hypertension: Secondary | ICD-10-CM | POA: Diagnosis not present

## 2015-05-03 DIAGNOSIS — R944 Abnormal results of kidney function studies: Secondary | ICD-10-CM | POA: Diagnosis not present

## 2015-05-03 DIAGNOSIS — G479 Sleep disorder, unspecified: Secondary | ICD-10-CM | POA: Diagnosis not present

## 2015-05-22 DIAGNOSIS — H353231 Exudative age-related macular degeneration, bilateral, with active choroidal neovascularization: Secondary | ICD-10-CM | POA: Diagnosis not present

## 2015-05-22 DIAGNOSIS — H35371 Puckering of macula, right eye: Secondary | ICD-10-CM | POA: Diagnosis not present

## 2015-05-25 DIAGNOSIS — G479 Sleep disorder, unspecified: Secondary | ICD-10-CM | POA: Diagnosis not present

## 2015-05-25 DIAGNOSIS — E119 Type 2 diabetes mellitus without complications: Secondary | ICD-10-CM | POA: Diagnosis not present

## 2015-05-25 DIAGNOSIS — H01009 Unspecified blepharitis unspecified eye, unspecified eyelid: Secondary | ICD-10-CM | POA: Diagnosis not present

## 2015-05-25 DIAGNOSIS — Z0000 Encounter for general adult medical examination without abnormal findings: Secondary | ICD-10-CM | POA: Diagnosis not present

## 2015-05-25 DIAGNOSIS — E7800 Pure hypercholesterolemia, unspecified: Secondary | ICD-10-CM | POA: Diagnosis not present

## 2015-05-25 DIAGNOSIS — R944 Abnormal results of kidney function studies: Secondary | ICD-10-CM | POA: Diagnosis not present

## 2015-05-25 DIAGNOSIS — I10 Essential (primary) hypertension: Secondary | ICD-10-CM | POA: Diagnosis not present

## 2015-06-21 DIAGNOSIS — N289 Disorder of kidney and ureter, unspecified: Secondary | ICD-10-CM | POA: Diagnosis not present

## 2015-07-02 DIAGNOSIS — H353231 Exudative age-related macular degeneration, bilateral, with active choroidal neovascularization: Secondary | ICD-10-CM | POA: Diagnosis not present

## 2015-07-02 DIAGNOSIS — E119 Type 2 diabetes mellitus without complications: Secondary | ICD-10-CM | POA: Diagnosis not present

## 2015-07-02 DIAGNOSIS — H35371 Puckering of macula, right eye: Secondary | ICD-10-CM | POA: Diagnosis not present

## 2015-07-02 DIAGNOSIS — H43813 Vitreous degeneration, bilateral: Secondary | ICD-10-CM | POA: Diagnosis not present

## 2015-08-13 DIAGNOSIS — H43813 Vitreous degeneration, bilateral: Secondary | ICD-10-CM | POA: Diagnosis not present

## 2015-08-13 DIAGNOSIS — E119 Type 2 diabetes mellitus without complications: Secondary | ICD-10-CM | POA: Diagnosis not present

## 2015-08-13 DIAGNOSIS — H353231 Exudative age-related macular degeneration, bilateral, with active choroidal neovascularization: Secondary | ICD-10-CM | POA: Diagnosis not present

## 2015-08-13 DIAGNOSIS — H35371 Puckering of macula, right eye: Secondary | ICD-10-CM | POA: Diagnosis not present

## 2015-09-14 DIAGNOSIS — N189 Chronic kidney disease, unspecified: Secondary | ICD-10-CM | POA: Diagnosis not present

## 2015-09-14 DIAGNOSIS — N289 Disorder of kidney and ureter, unspecified: Secondary | ICD-10-CM | POA: Diagnosis not present

## 2015-09-14 DIAGNOSIS — E119 Type 2 diabetes mellitus without complications: Secondary | ICD-10-CM | POA: Diagnosis not present

## 2015-09-14 DIAGNOSIS — N183 Chronic kidney disease, stage 3 (moderate): Secondary | ICD-10-CM | POA: Diagnosis not present

## 2015-09-14 DIAGNOSIS — Z7984 Long term (current) use of oral hypoglycemic drugs: Secondary | ICD-10-CM | POA: Diagnosis not present

## 2015-09-14 DIAGNOSIS — I10 Essential (primary) hypertension: Secondary | ICD-10-CM | POA: Diagnosis not present

## 2015-10-08 DIAGNOSIS — H43813 Vitreous degeneration, bilateral: Secondary | ICD-10-CM | POA: Diagnosis not present

## 2015-10-08 DIAGNOSIS — H35371 Puckering of macula, right eye: Secondary | ICD-10-CM | POA: Diagnosis not present

## 2015-10-08 DIAGNOSIS — H353231 Exudative age-related macular degeneration, bilateral, with active choroidal neovascularization: Secondary | ICD-10-CM | POA: Diagnosis not present

## 2015-11-01 DIAGNOSIS — N183 Chronic kidney disease, stage 3 (moderate): Secondary | ICD-10-CM | POA: Diagnosis not present

## 2015-11-01 DIAGNOSIS — E119 Type 2 diabetes mellitus without complications: Secondary | ICD-10-CM | POA: Diagnosis not present

## 2015-11-01 DIAGNOSIS — Z7984 Long term (current) use of oral hypoglycemic drugs: Secondary | ICD-10-CM | POA: Diagnosis not present

## 2015-11-01 DIAGNOSIS — G479 Sleep disorder, unspecified: Secondary | ICD-10-CM | POA: Diagnosis not present

## 2015-11-01 DIAGNOSIS — I10 Essential (primary) hypertension: Secondary | ICD-10-CM | POA: Diagnosis not present

## 2015-11-21 DIAGNOSIS — H35423 Microcystoid degeneration of retina, bilateral: Secondary | ICD-10-CM | POA: Diagnosis not present

## 2015-11-21 DIAGNOSIS — H35371 Puckering of macula, right eye: Secondary | ICD-10-CM | POA: Diagnosis not present

## 2015-11-21 DIAGNOSIS — H43813 Vitreous degeneration, bilateral: Secondary | ICD-10-CM | POA: Diagnosis not present

## 2015-11-21 DIAGNOSIS — H353231 Exudative age-related macular degeneration, bilateral, with active choroidal neovascularization: Secondary | ICD-10-CM | POA: Diagnosis not present

## 2015-11-29 DIAGNOSIS — R05 Cough: Secondary | ICD-10-CM | POA: Diagnosis not present

## 2015-12-25 DIAGNOSIS — H35423 Microcystoid degeneration of retina, bilateral: Secondary | ICD-10-CM | POA: Diagnosis not present

## 2015-12-25 DIAGNOSIS — H43813 Vitreous degeneration, bilateral: Secondary | ICD-10-CM | POA: Diagnosis not present

## 2015-12-25 DIAGNOSIS — H35373 Puckering of macula, bilateral: Secondary | ICD-10-CM | POA: Diagnosis not present

## 2015-12-25 DIAGNOSIS — H353231 Exudative age-related macular degeneration, bilateral, with active choroidal neovascularization: Secondary | ICD-10-CM | POA: Diagnosis not present

## 2016-01-08 ENCOUNTER — Telehealth: Payer: Self-pay | Admitting: Interventional Cardiology

## 2016-01-08 DIAGNOSIS — I350 Nonrheumatic aortic (valve) stenosis: Secondary | ICD-10-CM

## 2016-01-08 NOTE — Telephone Encounter (Signed)
Former patient of [facility], seen last in October 2016, re-est with Eldridge DaceVaranasi, gets Echo yearly, can get order?

## 2016-01-09 ENCOUNTER — Encounter: Payer: Self-pay | Admitting: Interventional Cardiology

## 2016-01-17 DIAGNOSIS — Z23 Encounter for immunization: Secondary | ICD-10-CM | POA: Diagnosis not present

## 2016-02-01 DIAGNOSIS — H35423 Microcystoid degeneration of retina, bilateral: Secondary | ICD-10-CM | POA: Diagnosis not present

## 2016-02-01 DIAGNOSIS — H43813 Vitreous degeneration, bilateral: Secondary | ICD-10-CM | POA: Diagnosis not present

## 2016-02-01 DIAGNOSIS — H35373 Puckering of macula, bilateral: Secondary | ICD-10-CM | POA: Diagnosis not present

## 2016-02-01 DIAGNOSIS — H353231 Exudative age-related macular degeneration, bilateral, with active choroidal neovascularization: Secondary | ICD-10-CM | POA: Diagnosis not present

## 2016-02-04 DIAGNOSIS — H903 Sensorineural hearing loss, bilateral: Secondary | ICD-10-CM | POA: Diagnosis not present

## 2016-02-06 NOTE — Progress Notes (Signed)
Cardiology Office Note   Date:  02/07/2016   ID:  Chad PavlovRobert E Enslin, DOB [birth year], MRN [id number]  PCP:  Duane LopeAlan Ross, MD    Chief Complaint  Patient presents with  . Aortic stenosis, mild  . Premature atrial contractions  . Benign hypertensive heart disease without heart failure     Wt Readings from Last 3 Encounters:  02/07/16 56.2 kg (123 lb 12.8 oz)  01/01/15 62.8 kg (138 lb 6.4 oz)  12/12/13 64 kg (141 lb)       History of Present Illness: Chad York is a 82 y.o. male who has a history of mild aortic stenosis. His valvular lesion has remained stable over the past several years. Patient had an updated echocardiogram on 12/08/12 showing a peak gradient of 24 and a mean gradient of 13, essentially no change. The patient has normal left ventricular function with ejection fraction of 55-65%..  The patient has remained physically active. He is not having any chest pain or shortness of breath. He works out on his treadmill at home for about 30 minutes. He has a history of essential hypertension.  He has not been having any peripheral edema or dizziness or syncope. His blood pressure here in the office is slightly elevated.  He checks his blood pressure at home and it is always normal.  He admits to having whitecoat syndrome. His PCP Dr. Tenny Crawoss has been checking his cholesterols. The patient is a mild diabetic.  His metformin was recently reduced to just once a day.  A repeat A1c is anticipated at Dr. Charlott Rakesoss's office soon.  He has been followed by Dr. Patty SermonsBrackbill.  This is his first visit with me.   He has had PACs in the past as well.  BP has been well controlled.  He has not felt any palpitations.  He had an echo today.  BP always high in the MDs office.  At hopme, it is much better.  Last night 125/70.  He takes his meds regularly.     Past Medical History:  Diagnosis Date  . Aortic stenosis   . Diabetes mellitus   . Dizzy spells    with near syncope at times  . H/O small  bowel obstruction    RESOLVED W/O SURGERY  . Hyperlipidemia     Past Surgical History:  Procedure Laterality Date  . APPENDECTOMY  2005   COMPLICATED BY ABSCESS FORMATION  . CATARACT EXTRACTION  2004   LEFT EYE  . COLONOSCOPY  2004     Current Outpatient Prescriptions  Medication Sig Dispense Refill  . amLODipine (NORVASC) 10 MG tablet Take 10 mg by mouth Daily.    Marland Kitchen. atorvastatin (LIPITOR) 20 MG tablet Take 20 mg by mouth Daily.    . bacitracin 500 UNIT/GM ointment Apply 1 application topically as directed.    . Multiple Vitamin (MULTIVITAMIN) tablet Take 1 tablet by mouth daily.    . ramipril (ALTACE) 10 MG capsule Take 10 mg by mouth Twice daily.    Marland Kitchen. zolpidem (AMBIEN) 10 MG tablet Take 10 mg by mouth. Take 1/2 tablet by mouth five times a week     No current facility-administered medications for this visit.     Allergies:   Septra [sulfamethoxazole-trimethoprim] and Ciprofloxacin    Social History:  The patient  reports that he quit smoking about 30 years ago. His smoking use included Cigarettes. He has never used smokeless tobacco. He reports that he drinks alcohol. He reports that he does  not use drugs.   Family History:  The patient's family history includes Heart disease in his brother, brother, and father; Heart failure in his father; Stroke in his brother.    ROS:  Please see the history of present illness.   Otherwise, review of systems are positive for knee pain.   All other systems are reviewed and negative.    PHYSICAL EXAM: VS:  BP (!) 190/76   Pulse 77   Ht 5\' 3"  (1.6 m)   Wt 56.2 kg (123 lb 12.8 oz)   SpO2 98%   BMI 21.93 kg/m  , BMI Body mass index is 21.93 kg/m. GEN: Well nourished, well developed, in no acute distress  HEENT: normal  Neck: no JVD, carotid bruits, or masses Cardiac: RRR; 2/6systolic murmur at right sternal border, no rubs, or gallops,no edema  Respiratory:  clear to auscultation bilaterally, normal work of breathing GI: soft,  nontender, nondistended, + BS MS: no deformity or atrophy  Skin: warm and dry, no rash Neuro:  Strength and sensation are intact Psych: euthymic mood, full affect   EKG:   The ekg ordered today demonstrates NSR, PAC   Recent Labs: No results found for requested labs within last 8760 hours.   Lipid Panel No results found for: CHOL, TRIG, HDL, CHOLHDL, VLDL, LDLCALC, LDLDIRECT   Other studies Reviewed: Additional studies/ records that were reviewed today with results demonstrating: prior echo reviewed.   ASSESSMENT AND PLAN:  1. Mild aortic stenosis :  Getting echo today.  No sx of severe AS.  Exercising regularly.  If still mild, would not repeat an echo next year. 2. HTN: COntrolled at home.  No low BP or syncope. 3. Hyperlipidemia: COntinue atorvastatin.  Checked by Dr. Tenny Crawoss. 4. DM: Metformin decreased.  Blood sugars have been controlled. Continue regular exercise. 5. PACs: Noted on ECG today.  No sx.  No further treatment planned.   Current medicines are reviewed at length with the patient today.  The patient concerns regarding his medicines were addressed.  The following changes have been made:  No change  Labs/ tests ordered today include:   Orders Placed This Encounter  Procedures  . EKG 12-Lead    Recommend 150 minutes/week of aerobic exercise Low fat, low carb, high fiber diet recommended  Disposition:   FU in 1 year   Signed, Lance MussJayadeep Ottis Sarnowski, MD  02/07/2016 9:24 AM    [facility] [address], [place], [place]  [address] Phone: [phone](336) [phone]; Fax: [phone]

## 2016-02-07 ENCOUNTER — Ambulatory Visit (HOSPITAL_COMMUNITY): Payer: Medicare Other | Attending: Cardiology

## 2016-02-07 ENCOUNTER — Ambulatory Visit (INDEPENDENT_AMBULATORY_CARE_PROVIDER_SITE_OTHER): Payer: Medicare Other | Admitting: Interventional Cardiology

## 2016-02-07 ENCOUNTER — Other Ambulatory Visit: Payer: Self-pay

## 2016-02-07 ENCOUNTER — Encounter: Payer: Self-pay | Admitting: Interventional Cardiology

## 2016-02-07 VITALS — BP 190/76 | HR 77 | Ht 63.0 in | Wt 123.8 lb

## 2016-02-07 DIAGNOSIS — I350 Nonrheumatic aortic (valve) stenosis: Secondary | ICD-10-CM

## 2016-02-07 DIAGNOSIS — E785 Hyperlipidemia, unspecified: Secondary | ICD-10-CM | POA: Diagnosis not present

## 2016-02-07 DIAGNOSIS — E119 Type 2 diabetes mellitus without complications: Secondary | ICD-10-CM | POA: Insufficient documentation

## 2016-02-07 DIAGNOSIS — I491 Atrial premature depolarization: Secondary | ICD-10-CM | POA: Diagnosis not present

## 2016-02-07 DIAGNOSIS — I119 Hypertensive heart disease without heart failure: Secondary | ICD-10-CM | POA: Diagnosis not present

## 2016-02-07 NOTE — Patient Instructions (Signed)
**Note De-identified Chad York Obfuscation** Medication Instructions:  Same-no changes  Labwork: None  Testing/Procedures: None  Follow-Up: Your physician wants you to follow-up in: 1 year. You will receive a reminder letter in the mail two months in advance. If you don't receive a letter, please call our office to schedule the follow-up appointment.      If you need a refill on your cardiac medications before your next appointment, please call your pharmacy.   

## 2016-03-07 DIAGNOSIS — H35423 Microcystoid degeneration of retina, bilateral: Secondary | ICD-10-CM | POA: Diagnosis not present

## 2016-03-07 DIAGNOSIS — H35373 Puckering of macula, bilateral: Secondary | ICD-10-CM | POA: Diagnosis not present

## 2016-03-07 DIAGNOSIS — H353231 Exudative age-related macular degeneration, bilateral, with active choroidal neovascularization: Secondary | ICD-10-CM | POA: Diagnosis not present

## 2016-03-07 DIAGNOSIS — H43813 Vitreous degeneration, bilateral: Secondary | ICD-10-CM | POA: Diagnosis not present

## 2016-04-11 DIAGNOSIS — H35371 Puckering of macula, right eye: Secondary | ICD-10-CM | POA: Diagnosis not present

## 2016-04-11 DIAGNOSIS — H353231 Exudative age-related macular degeneration, bilateral, with active choroidal neovascularization: Secondary | ICD-10-CM | POA: Diagnosis not present

## 2016-04-11 DIAGNOSIS — H35423 Microcystoid degeneration of retina, bilateral: Secondary | ICD-10-CM | POA: Diagnosis not present

## 2016-04-11 DIAGNOSIS — H43813 Vitreous degeneration, bilateral: Secondary | ICD-10-CM | POA: Diagnosis not present

## 2016-04-23 DIAGNOSIS — M1711 Unilateral primary osteoarthritis, right knee: Secondary | ICD-10-CM | POA: Diagnosis not present

## 2016-05-14 DIAGNOSIS — H353231 Exudative age-related macular degeneration, bilateral, with active choroidal neovascularization: Secondary | ICD-10-CM | POA: Diagnosis not present

## 2016-05-14 DIAGNOSIS — H43813 Vitreous degeneration, bilateral: Secondary | ICD-10-CM | POA: Diagnosis not present

## 2016-05-14 DIAGNOSIS — H35423 Microcystoid degeneration of retina, bilateral: Secondary | ICD-10-CM | POA: Diagnosis not present

## 2016-05-14 DIAGNOSIS — H35371 Puckering of macula, right eye: Secondary | ICD-10-CM | POA: Diagnosis not present

## 2016-05-21 DIAGNOSIS — E7800 Pure hypercholesterolemia, unspecified: Secondary | ICD-10-CM | POA: Diagnosis not present

## 2016-05-21 DIAGNOSIS — E1122 Type 2 diabetes mellitus with diabetic chronic kidney disease: Secondary | ICD-10-CM | POA: Diagnosis not present

## 2016-05-21 DIAGNOSIS — I10 Essential (primary) hypertension: Secondary | ICD-10-CM | POA: Diagnosis not present

## 2016-05-21 DIAGNOSIS — N183 Chronic kidney disease, stage 3 (moderate): Secondary | ICD-10-CM | POA: Diagnosis not present

## 2016-05-21 DIAGNOSIS — Z0000 Encounter for general adult medical examination without abnormal findings: Secondary | ICD-10-CM | POA: Diagnosis not present

## 2016-05-21 DIAGNOSIS — E119 Type 2 diabetes mellitus without complications: Secondary | ICD-10-CM | POA: Diagnosis not present

## 2016-05-21 DIAGNOSIS — G479 Sleep disorder, unspecified: Secondary | ICD-10-CM | POA: Diagnosis not present

## 2016-05-21 DIAGNOSIS — M549 Dorsalgia, unspecified: Secondary | ICD-10-CM | POA: Diagnosis not present

## 2016-06-20 DIAGNOSIS — H353231 Exudative age-related macular degeneration, bilateral, with active choroidal neovascularization: Secondary | ICD-10-CM | POA: Diagnosis not present

## 2016-06-20 DIAGNOSIS — H43813 Vitreous degeneration, bilateral: Secondary | ICD-10-CM | POA: Diagnosis not present

## 2016-06-20 DIAGNOSIS — H35423 Microcystoid degeneration of retina, bilateral: Secondary | ICD-10-CM | POA: Diagnosis not present

## 2016-06-20 DIAGNOSIS — H43391 Other vitreous opacities, right eye: Secondary | ICD-10-CM | POA: Diagnosis not present

## 2016-07-25 DIAGNOSIS — H35371 Puckering of macula, right eye: Secondary | ICD-10-CM | POA: Diagnosis not present

## 2016-07-25 DIAGNOSIS — H35423 Microcystoid degeneration of retina, bilateral: Secondary | ICD-10-CM | POA: Diagnosis not present

## 2016-07-25 DIAGNOSIS — H353231 Exudative age-related macular degeneration, bilateral, with active choroidal neovascularization: Secondary | ICD-10-CM | POA: Diagnosis not present

## 2016-07-25 DIAGNOSIS — H43813 Vitreous degeneration, bilateral: Secondary | ICD-10-CM | POA: Diagnosis not present

## 2016-08-07 DIAGNOSIS — H6122 Impacted cerumen, left ear: Secondary | ICD-10-CM | POA: Diagnosis not present

## 2016-08-11 DIAGNOSIS — H35423 Microcystoid degeneration of retina, bilateral: Secondary | ICD-10-CM | POA: Diagnosis not present

## 2016-08-11 DIAGNOSIS — H353231 Exudative age-related macular degeneration, bilateral, with active choroidal neovascularization: Secondary | ICD-10-CM | POA: Diagnosis not present

## 2016-08-11 DIAGNOSIS — H35371 Puckering of macula, right eye: Secondary | ICD-10-CM | POA: Diagnosis not present

## 2016-09-02 DIAGNOSIS — H35423 Microcystoid degeneration of retina, bilateral: Secondary | ICD-10-CM | POA: Diagnosis not present

## 2016-09-02 DIAGNOSIS — H35371 Puckering of macula, right eye: Secondary | ICD-10-CM | POA: Diagnosis not present

## 2016-09-02 DIAGNOSIS — H353231 Exudative age-related macular degeneration, bilateral, with active choroidal neovascularization: Secondary | ICD-10-CM | POA: Diagnosis not present

## 2016-09-02 DIAGNOSIS — H43813 Vitreous degeneration, bilateral: Secondary | ICD-10-CM | POA: Diagnosis not present

## 2016-09-08 DIAGNOSIS — R102 Pelvic and perineal pain: Secondary | ICD-10-CM | POA: Diagnosis not present

## 2016-10-10 DIAGNOSIS — H35423 Microcystoid degeneration of retina, bilateral: Secondary | ICD-10-CM | POA: Diagnosis not present

## 2016-10-10 DIAGNOSIS — H35371 Puckering of macula, right eye: Secondary | ICD-10-CM | POA: Diagnosis not present

## 2016-10-10 DIAGNOSIS — H353231 Exudative age-related macular degeneration, bilateral, with active choroidal neovascularization: Secondary | ICD-10-CM | POA: Diagnosis not present

## 2016-10-10 DIAGNOSIS — H43813 Vitreous degeneration, bilateral: Secondary | ICD-10-CM | POA: Diagnosis not present

## 2016-11-14 DIAGNOSIS — H43813 Vitreous degeneration, bilateral: Secondary | ICD-10-CM | POA: Diagnosis not present

## 2016-11-14 DIAGNOSIS — H35423 Microcystoid degeneration of retina, bilateral: Secondary | ICD-10-CM | POA: Diagnosis not present

## 2016-11-14 DIAGNOSIS — H35371 Puckering of macula, right eye: Secondary | ICD-10-CM | POA: Diagnosis not present

## 2016-11-14 DIAGNOSIS — H353231 Exudative age-related macular degeneration, bilateral, with active choroidal neovascularization: Secondary | ICD-10-CM | POA: Diagnosis not present

## 2016-11-26 DIAGNOSIS — Z23 Encounter for immunization: Secondary | ICD-10-CM | POA: Diagnosis not present

## 2016-11-26 DIAGNOSIS — N183 Chronic kidney disease, stage 3 (moderate): Secondary | ICD-10-CM | POA: Diagnosis not present

## 2016-11-26 DIAGNOSIS — G479 Sleep disorder, unspecified: Secondary | ICD-10-CM | POA: Diagnosis not present

## 2016-11-26 DIAGNOSIS — E1122 Type 2 diabetes mellitus with diabetic chronic kidney disease: Secondary | ICD-10-CM | POA: Diagnosis not present

## 2016-12-19 DIAGNOSIS — H353231 Exudative age-related macular degeneration, bilateral, with active choroidal neovascularization: Secondary | ICD-10-CM | POA: Diagnosis not present

## 2016-12-19 DIAGNOSIS — H35423 Microcystoid degeneration of retina, bilateral: Secondary | ICD-10-CM | POA: Diagnosis not present

## 2016-12-19 DIAGNOSIS — H43813 Vitreous degeneration, bilateral: Secondary | ICD-10-CM | POA: Diagnosis not present

## 2016-12-19 DIAGNOSIS — H35371 Puckering of macula, right eye: Secondary | ICD-10-CM | POA: Diagnosis not present

## 2017-01-20 ENCOUNTER — Encounter: Payer: Self-pay | Admitting: Interventional Cardiology

## 2017-01-27 DIAGNOSIS — H353231 Exudative age-related macular degeneration, bilateral, with active choroidal neovascularization: Secondary | ICD-10-CM | POA: Diagnosis not present

## 2017-01-27 DIAGNOSIS — H35423 Microcystoid degeneration of retina, bilateral: Secondary | ICD-10-CM | POA: Diagnosis not present

## 2017-01-27 DIAGNOSIS — H35371 Puckering of macula, right eye: Secondary | ICD-10-CM | POA: Diagnosis not present

## 2017-01-27 DIAGNOSIS — E119 Type 2 diabetes mellitus without complications: Secondary | ICD-10-CM | POA: Diagnosis not present

## 2017-02-04 NOTE — Progress Notes (Signed)
Cardiology Office Note   Date:  02/05/2017   ID:  Chad PavlovRobert E Gazda, DOB 03-08-1928, MRN [id number]  PCP:  Daisy Florooss, Charles Alan, MD    No chief complaint on file.  Aortic stenosis  Wt Readings from Last 3 Encounters:  02/05/17 130 lb 6.4 oz (59.1 kg)  02/07/16 123 lb 12.8 oz (56.2 kg)  01/01/15 138 lb 6.4 oz (62.8 kg)       History of Present Illness: Chad York is a 83 y.o. male  who has a history of mild aortic stenosis. His valvular lesion has remained stable over the past several years. Patient had an updated echocardiogram on 12/08/12 showing a peak gradient of 24 and a mean gradient of 13, essentially no change. The patient has normal left ventricular function with ejection fraction of 55-65%..   He has been followed by Dr. Patty SermonsBrackbill.    He has had PACs in the past as well.  BP has been well controlled.  He has not felt any palpitations.  He had an echo today.  BP always high in the MDs office.  At home, it is much better.    He continues to walk on the treadmill 5-7 days/week.    Denies : Chest pain. Dizziness. Leg edema. Nitroglycerin use. Orthopnea. Palpitations. Paroxysmal nocturnal dyspnea. Shortness of breath. Syncope.   BP this morning at home, 124/63.    Past Medical History:  Diagnosis Date  . Aortic stenosis   . Diabetes mellitus   . Dizzy spells    with near syncope at times  . H/O small bowel obstruction    RESOLVED W/O SURGERY  . Hyperlipidemia     Past Surgical History:  Procedure Laterality Date  . APPENDECTOMY  2005   COMPLICATED BY ABSCESS FORMATION  . CATARACT EXTRACTION  2004   LEFT EYE  . COLONOSCOPY  2004     Current Outpatient Medications  Medication Sig Dispense Refill  . amLODipine (NORVASC) 10 MG tablet Take 10 mg by mouth Daily.    Marland Kitchen. atorvastatin (LIPITOR) 20 MG tablet Take 20 mg by mouth Daily.    . bacitracin 500 UNIT/GM ointment Apply 1 application topically as directed.    . Multiple Vitamin (MULTIVITAMIN) tablet  Take 1 tablet by mouth daily.    . ramipril (ALTACE) 10 MG capsule Take 10 mg by mouth Twice daily.    Marland Kitchen. zolpidem (AMBIEN) 10 MG tablet Take 10 mg by mouth. Take 1/2 tablet by mouth five times a week     No current facility-administered medications for this visit.     Allergies:   Septra [sulfamethoxazole-trimethoprim] and Ciprofloxacin    Social History:  The patient  reports that he quit smoking about 31 years ago. His smoking use included cigarettes. he has never used smokeless tobacco. He reports that he drinks alcohol. He reports that he does not use drugs.   Family History:  The patient's family history includes Heart disease in his brother, brother, and father; Heart failure in his father; Stroke in his brother.    ROS:  Please see the history of present illness.   Otherwise, review of systems are positive for neck pain.   All other systems are reviewed and negative.    PHYSICAL EXAM: VS:  BP (!) 152/76   Pulse 84   Ht 5\' 3"  (1.6 m)   Wt 130 lb 6.4 oz (59.1 kg)   SpO2 99%   BMI 23.10 kg/m  , BMI Body mass index is 23.1  kg/m. GEN: Well nourished, well developed, in no acute distress  HEENT: normal  Neck: no JVD, carotid bruits, or masses Cardiac: RRR; 2 /6 harsh systolic murmur, no rubs, or gallops,no edema  Respiratory:  clear to auscultation bilaterally, normal work of breathing GI: soft, nontender, nondistended, + BS MS: no deformity or atrophy  Skin: warm and dry, no rash Neuro:  Strength and sensation are intact Psych: euthymic mood, full affect   EKG:   The ekg ordered today demonstrates NSR, no ST segment changes   Recent Labs: No results found for requested labs within last 8760 hours.   Lipid Panel No results found for: CHOL, TRIG, HDL, CHOLHDL, VLDL, LDLCALC, LDLDIRECT   Other studies Reviewed: Additional studies/ records that were reviewed today with results demonstrating: 11/17: Normal LV function, mild to modeate AS.Marland Kitchen.   ASSESSMENT AND  PLAN:  1. Aortic stenosis: Mild in the past.  No symptoms of severe aortic stenosis.  No plan for echocardiogram at this time. 2. Hypertensive heart disease without heart failure: Controlled at home.  Continue current medications.  He typically has elevated readings in the doctor's office. 3. Hyperlipidemia: LDL 65 in 2/18. Continue atorvastatin.  4. DM: Now off medicine.  Controlling with diet. 5. PACs: He is asymptomatic.  One heard on exam today.     Current medicines are reviewed at length with the patient today.  The patient concerns regarding his medicines were addressed.  The following changes have been made:  No change  Labs/ tests ordered today include:  No orders of the defined types were placed in this encounter.   Recommend 150 minutes/week of aerobic exercise Low fat, low carb, high fiber diet recommended  Disposition:   FU in 1 year   Signed, Lance MussJayadeep Chloris Marcoux, MD  02/05/2017 10:33 AM    [facility] [address], [place], [place]  [address] Phone: [phone](336) [phone]; Fax: [phone]

## 2017-02-05 ENCOUNTER — Encounter: Payer: Self-pay | Admitting: Interventional Cardiology

## 2017-02-05 ENCOUNTER — Ambulatory Visit (INDEPENDENT_AMBULATORY_CARE_PROVIDER_SITE_OTHER): Payer: Medicare Other | Admitting: Interventional Cardiology

## 2017-02-05 VITALS — BP 152/76 | HR 84 | Ht 63.0 in | Wt 130.4 lb

## 2017-02-05 DIAGNOSIS — I119 Hypertensive heart disease without heart failure: Secondary | ICD-10-CM | POA: Diagnosis not present

## 2017-02-05 DIAGNOSIS — E782 Mixed hyperlipidemia: Secondary | ICD-10-CM

## 2017-02-05 DIAGNOSIS — I491 Atrial premature depolarization: Secondary | ICD-10-CM

## 2017-02-05 DIAGNOSIS — I350 Nonrheumatic aortic (valve) stenosis: Secondary | ICD-10-CM | POA: Diagnosis not present

## 2017-02-05 DIAGNOSIS — E119 Type 2 diabetes mellitus without complications: Secondary | ICD-10-CM | POA: Diagnosis not present

## 2017-02-05 NOTE — Patient Instructions (Signed)

## 2017-02-11 DIAGNOSIS — H903 Sensorineural hearing loss, bilateral: Secondary | ICD-10-CM | POA: Diagnosis not present

## 2017-03-03 DIAGNOSIS — H43813 Vitreous degeneration, bilateral: Secondary | ICD-10-CM | POA: Diagnosis not present

## 2017-03-03 DIAGNOSIS — H35423 Microcystoid degeneration of retina, bilateral: Secondary | ICD-10-CM | POA: Diagnosis not present

## 2017-03-03 DIAGNOSIS — H43391 Other vitreous opacities, right eye: Secondary | ICD-10-CM | POA: Diagnosis not present

## 2017-03-03 DIAGNOSIS — H353231 Exudative age-related macular degeneration, bilateral, with active choroidal neovascularization: Secondary | ICD-10-CM | POA: Diagnosis not present

## 2017-04-10 DIAGNOSIS — H353231 Exudative age-related macular degeneration, bilateral, with active choroidal neovascularization: Secondary | ICD-10-CM | POA: Diagnosis not present

## 2017-05-22 DIAGNOSIS — H35371 Puckering of macula, right eye: Secondary | ICD-10-CM | POA: Diagnosis not present

## 2017-05-22 DIAGNOSIS — H43813 Vitreous degeneration, bilateral: Secondary | ICD-10-CM | POA: Diagnosis not present

## 2017-05-22 DIAGNOSIS — H35423 Microcystoid degeneration of retina, bilateral: Secondary | ICD-10-CM | POA: Diagnosis not present

## 2017-05-22 DIAGNOSIS — H353231 Exudative age-related macular degeneration, bilateral, with active choroidal neovascularization: Secondary | ICD-10-CM | POA: Diagnosis not present

## 2017-06-24 DIAGNOSIS — E7800 Pure hypercholesterolemia, unspecified: Secondary | ICD-10-CM | POA: Diagnosis not present

## 2017-06-24 DIAGNOSIS — Z0000 Encounter for general adult medical examination without abnormal findings: Secondary | ICD-10-CM | POA: Diagnosis not present

## 2017-06-24 DIAGNOSIS — I10 Essential (primary) hypertension: Secondary | ICD-10-CM | POA: Diagnosis not present

## 2017-06-24 DIAGNOSIS — G479 Sleep disorder, unspecified: Secondary | ICD-10-CM | POA: Diagnosis not present

## 2017-06-24 DIAGNOSIS — N183 Chronic kidney disease, stage 3 (moderate): Secondary | ICD-10-CM | POA: Diagnosis not present

## 2017-06-24 DIAGNOSIS — E1122 Type 2 diabetes mellitus with diabetic chronic kidney disease: Secondary | ICD-10-CM | POA: Diagnosis not present

## 2017-06-26 DIAGNOSIS — H353231 Exudative age-related macular degeneration, bilateral, with active choroidal neovascularization: Secondary | ICD-10-CM | POA: Diagnosis not present

## 2017-06-26 DIAGNOSIS — H43813 Vitreous degeneration, bilateral: Secondary | ICD-10-CM | POA: Diagnosis not present

## 2017-06-26 DIAGNOSIS — H35423 Microcystoid degeneration of retina, bilateral: Secondary | ICD-10-CM | POA: Diagnosis not present

## 2017-06-26 DIAGNOSIS — H35373 Puckering of macula, bilateral: Secondary | ICD-10-CM | POA: Diagnosis not present

## 2017-07-31 DIAGNOSIS — H35423 Microcystoid degeneration of retina, bilateral: Secondary | ICD-10-CM | POA: Diagnosis not present

## 2017-07-31 DIAGNOSIS — H35373 Puckering of macula, bilateral: Secondary | ICD-10-CM | POA: Diagnosis not present

## 2017-07-31 DIAGNOSIS — H353231 Exudative age-related macular degeneration, bilateral, with active choroidal neovascularization: Secondary | ICD-10-CM | POA: Diagnosis not present

## 2017-07-31 DIAGNOSIS — H43813 Vitreous degeneration, bilateral: Secondary | ICD-10-CM | POA: Diagnosis not present

## 2017-09-01 DIAGNOSIS — H43813 Vitreous degeneration, bilateral: Secondary | ICD-10-CM | POA: Diagnosis not present

## 2017-09-01 DIAGNOSIS — H35373 Puckering of macula, bilateral: Secondary | ICD-10-CM | POA: Diagnosis not present

## 2017-09-01 DIAGNOSIS — H35423 Microcystoid degeneration of retina, bilateral: Secondary | ICD-10-CM | POA: Diagnosis not present

## 2017-09-01 DIAGNOSIS — H353231 Exudative age-related macular degeneration, bilateral, with active choroidal neovascularization: Secondary | ICD-10-CM | POA: Diagnosis not present

## 2017-10-09 DIAGNOSIS — H43813 Vitreous degeneration, bilateral: Secondary | ICD-10-CM | POA: Diagnosis not present

## 2017-10-09 DIAGNOSIS — H35373 Puckering of macula, bilateral: Secondary | ICD-10-CM | POA: Diagnosis not present

## 2017-10-09 DIAGNOSIS — H353231 Exudative age-related macular degeneration, bilateral, with active choroidal neovascularization: Secondary | ICD-10-CM | POA: Diagnosis not present

## 2017-10-09 DIAGNOSIS — H35423 Microcystoid degeneration of retina, bilateral: Secondary | ICD-10-CM | POA: Diagnosis not present

## 2017-11-13 DIAGNOSIS — H353231 Exudative age-related macular degeneration, bilateral, with active choroidal neovascularization: Secondary | ICD-10-CM | POA: Diagnosis not present

## 2017-11-13 DIAGNOSIS — H35423 Microcystoid degeneration of retina, bilateral: Secondary | ICD-10-CM | POA: Diagnosis not present

## 2017-11-13 DIAGNOSIS — H43813 Vitreous degeneration, bilateral: Secondary | ICD-10-CM | POA: Diagnosis not present

## 2017-11-13 DIAGNOSIS — H35373 Puckering of macula, bilateral: Secondary | ICD-10-CM | POA: Diagnosis not present

## 2017-12-18 DIAGNOSIS — H43813 Vitreous degeneration, bilateral: Secondary | ICD-10-CM | POA: Diagnosis not present

## 2017-12-18 DIAGNOSIS — H353231 Exudative age-related macular degeneration, bilateral, with active choroidal neovascularization: Secondary | ICD-10-CM | POA: Diagnosis not present

## 2017-12-18 DIAGNOSIS — H35373 Puckering of macula, bilateral: Secondary | ICD-10-CM | POA: Diagnosis not present

## 2017-12-18 DIAGNOSIS — H35423 Microcystoid degeneration of retina, bilateral: Secondary | ICD-10-CM | POA: Diagnosis not present

## 2017-12-23 DIAGNOSIS — M65352 Trigger finger, left little finger: Secondary | ICD-10-CM | POA: Diagnosis not present

## 2017-12-23 DIAGNOSIS — M65341 Trigger finger, right ring finger: Secondary | ICD-10-CM | POA: Diagnosis not present

## 2017-12-28 DIAGNOSIS — Z23 Encounter for immunization: Secondary | ICD-10-CM | POA: Diagnosis not present

## 2017-12-28 DIAGNOSIS — G479 Sleep disorder, unspecified: Secondary | ICD-10-CM | POA: Diagnosis not present

## 2017-12-28 DIAGNOSIS — I10 Essential (primary) hypertension: Secondary | ICD-10-CM | POA: Diagnosis not present

## 2017-12-28 DIAGNOSIS — E1122 Type 2 diabetes mellitus with diabetic chronic kidney disease: Secondary | ICD-10-CM | POA: Diagnosis not present

## 2017-12-28 DIAGNOSIS — N183 Chronic kidney disease, stage 3 (moderate): Secondary | ICD-10-CM | POA: Diagnosis not present

## 2018-01-26 DIAGNOSIS — H353231 Exudative age-related macular degeneration, bilateral, with active choroidal neovascularization: Secondary | ICD-10-CM | POA: Diagnosis not present

## 2018-01-26 DIAGNOSIS — E119 Type 2 diabetes mellitus without complications: Secondary | ICD-10-CM | POA: Diagnosis not present

## 2018-01-26 DIAGNOSIS — H43813 Vitreous degeneration, bilateral: Secondary | ICD-10-CM | POA: Diagnosis not present

## 2018-01-26 DIAGNOSIS — H35373 Puckering of macula, bilateral: Secondary | ICD-10-CM | POA: Diagnosis not present

## 2018-02-08 ENCOUNTER — Encounter: Payer: Self-pay | Admitting: Cardiology

## 2018-02-08 ENCOUNTER — Ambulatory Visit (INDEPENDENT_AMBULATORY_CARE_PROVIDER_SITE_OTHER): Payer: Medicare Other | Admitting: Cardiology

## 2018-02-08 VITALS — BP 134/70 | HR 84 | Ht 63.0 in | Wt 128.8 lb

## 2018-02-08 DIAGNOSIS — I350 Nonrheumatic aortic (valve) stenosis: Secondary | ICD-10-CM | POA: Diagnosis not present

## 2018-02-08 NOTE — Progress Notes (Signed)
02/08/2018 Chad York   [birth year]  [id number]  Primary Physician Daisy Floro, MD Primary Cardiologist: Dr. Eldridge Dace   Reason for Visit/CC: Yearly F/u for Aortic Stenosis  HPI:  Chad York is a 84 y.o. male who is being seen today for yearly f/u given h/o Aortic stenosis, HTN, HLD, PACs and DM. He is a former pt of Dr. Patty Sermons but now followed by Dr. Eldridge Dace. He gets echocardiograms every several years for surveillance. His last echo was in 2017 and showed mild to moderate AS. Mean gradient was 13 mmHg. LVEF was normal at 55%. No WMAs. G1DD was noted.   He reports he has done well since his last OV. He denies CP, dyspnea, syncope/ near syncope, orthopnea, PND, LEE, dizziness. He walks on the treadmill  5 days a week, 30 min at a time. Fairly good exercise tolerance. He reports full med compliance. His PCP follows labs. Lipid panel earlier this year showed LDL in the 40s.   Cardiac Studies  2D Echo 02/07/2016 Study Conclusions  - Left ventricle: The cavity size was normal. Wall thickness was   normal. The estimated ejection fraction was 55%. Wall motion was   normal; there were no regional wall motion abnormalities. Doppler   parameters are consistent with abnormal left ventricular   relaxation (grade 1 diastolic dysfunction). - Aortic valve: Probably trileaflet; severely calcified leaflets.   Aortic stenosis appears at least moderate. Mean gradient (S): 13   mm Hg. Peak gradient (S): 22 mm Hg. Valve area (VTI): 0.89 cm^2. - Mitral valve: Mildly calcified annulus. There was trivial   regurgitation. - Right ventricle: The cavity size was normal. Systolic function   was normal. - Tricuspid valve: Peak RV-RA gradient (S): 31 mm Hg. - Pulmonary arteries: PA peak pressure: 34 mm Hg (S). - Inferior vena cava: The vessel was normal in size. The   respirophasic diameter changes were in the normal range (= 50%),   consistent with normal central venous  pressure.  Impressions:  - Normal LV size with EF 55%. Normal RV size and systolic function.   The aortic valve was heavily calcified. It is not   well-visualized. There is significant stenosis. Mean gradient   only 13 mmHg but AVA calculated to 0.89 cm^2. ?Low gradient   moderate or severe aortic stenosis. Would consider TEE to assess   the valve more closely.  Current Meds  Medication Sig  . amLODipine (NORVASC) 10 MG tablet Take 10 mg by mouth Daily.  Marland Kitchen atorvastatin (LIPITOR) 20 MG tablet Take 20 mg by mouth Daily.  . bacitracin 500 UNIT/GM ointment Apply 1 application topically as directed.  . Multiple Vitamin (MULTIVITAMIN) tablet Take 1 tablet by mouth daily.  . ramipril (ALTACE) 10 MG capsule Take 10 mg by mouth Twice daily.  Marland Kitchen zolpidem (AMBIEN) 10 MG tablet Take 10 mg by mouth. Take 1/2 tablet by mouth five times a week   Allergies  Allergen Reactions  . Ciprofloxacin Rash  . Sulfamethoxazole-Trimethoprim Rash    rash rash   Past Medical History:  Diagnosis Date  . Aortic stenosis   . Diabetes mellitus   . Dizzy spells    with near syncope at times  . H/O small bowel obstruction    RESOLVED W/O SURGERY  . Hyperlipidemia    Family History  Problem Relation Age of Onset  . Heart disease Father   . Heart failure Father   . Heart disease Brother  CABG  . Stroke Brother   . Heart disease Brother    Past Surgical History:  Procedure Laterality Date  . APPENDECTOMY  2005   COMPLICATED BY ABSCESS FORMATION  . CATARACT EXTRACTION  2004   LEFT EYE  . COLONOSCOPY  2004   Social History   Socioeconomic History  . Marital status: Married    Spouse name: Not on file  . Number of children: 2  . Years of education: Not on file  . Highest education level: Not on file  Occupational History  . Occupation: RETIRED    Comment: Development worker, community [employer]  Social Needs  . Financial resource strain: Not on file  . Food insecurity:    Worry:  Not on file    Inability: Not on file  . Transportation needs:    Medical: Not on file    Non-medical: Not on file  Tobacco Use  . Smoking status: Former Smoker    Types: Cigarettes    Last attempt to quit: 03/31/1985    Years since quitting: 32.8  . Smokeless tobacco: Never Used  Substance and Sexual Activity  . Alcohol use: Yes    Comment: 4-6 DRINKS WEEKLY   . Drug use: No  . Sexual activity: Not on file  Lifestyle  . Physical activity:    Days per week: Not on file    Minutes per session: Not on file  . Stress: Not on file  Relationships  . Social connections:    Talks on phone: Not on file    Gets together: Not on file    Attends religious service: Not on file    Active member of club or organization: Not on file    Attends meetings of clubs or organizations: Not on file    Relationship status: Not on file  . Intimate partner violence:    Fear of current or ex partner: Not on file    Emotionally abused: Not on file    Physically abused: Not on file    Forced sexual activity: Not on file  Other Topics Concern  . Not on file  Social History Narrative  . Not on file     Review of Systems: General: negative for chills, fever, night sweats or weight changes.  Cardiovascular: negative for chest pain, dyspnea on exertion, edema, orthopnea, palpitations, paroxysmal nocturnal dyspnea or shortness of breath Dermatological: negative for rash Respiratory: negative for cough or wheezing Urologic: negative for hematuria Abdominal: negative for nausea, vomiting, diarrhea, bright red blood per rectum, melena, or hematemesis Neurologic: negative for visual changes, syncope, or dizziness All other systems reviewed and are otherwise negative except as noted above.   Physical Exam:  Blood pressure 134/70, pulse 84, height 5\' 3"  (1.6 m), weight 128 lb 12.8 oz (58.4 kg), SpO2 97 %.  General appearance: alert, cooperative and no distress Neck: no carotid bruit and no JVD Lungs:  clear to auscultation bilaterally Heart: regular rate and rhythm and 2/6 SM at RUSB radiating to both carotids. Extremities: extremities normal, atraumatic, no cyanosis or edema Pulses: 2+ and symmetric Skin: Skin color, texture, turgor normal. No rashes or lesions Neurologic: Grossly normal  EKG SR, w/ PACs 84 bpm -- personally reviewed   ASSESSMENT AND PLAN:   1. Aortic Stenosis: last echo in 2017 showed moderate AS with mean gradient at 13 mm Hg. Murmur heard on exam with radiation to the carotids. He denies any CP, dyspnea, syncope or near syncope, however we will repeat echo to  reassess severity of AS.   2. HTN: controlled on current regimen. No changes made today.   3. PACs: PACs noted on tele, but asymptomatic.   4. HLD: on statin therapy. Lipid panel followed by PCP and LDL well controlled in the 40s.   Follow-Up w/ Dr. Eldridge Dace in 1 yr  Knute Neu, MHS [facility] 02/08/2018 3:21 PM

## 2018-02-08 NOTE — Patient Instructions (Signed)
Medication Instructions:  Your physician recommends that you continue on your current medications as directed. Please refer to the Current Medication list given to you today.  If you need a refill on your cardiac medications before your next appointment, please call your pharmacy.   Lab work: NONE If you have labs (blood work) drawn today and your tests are completely normal, you will receive your results only by: Marland Kitchen MyChart Message (if you have MyChart) OR . A paper copy in the mail If you have any lab test that is abnormal or we need to change your treatment, we will call you to review the results.  Testing/Procedures: Your physician has requested that you have an echocardiogram. Echocardiography is a painless test that uses sound waves to create images of your heart. It provides your doctor with information about the size and shape of your heart and how well your heart's chambers and valves are working. This procedure takes approximately one hour. There are no restrictions for this procedure.    Follow-Up: At [facility], you and your health needs are our priority.  As part of our continuing mission to provide you with exceptional heart care, we have created designated Provider Care Teams.  These Care Teams include your primary Cardiologist (physician) and Advanced Practice Providers (APPs -  Physician Assistants and Nurse Practitioners) who all work together to provide you with the care you need, when you need it. You will need a follow up appointment in 1 years.  Please call our office 2 months in advance to schedule this appointment.  You may see DR. VARANASI or one of the following Advanced Practice Providers on your designated Care Team:   [place], PA-C Dayna Dunn, PA-C . Jacolyn Reedy, PA-C  Any Other Special Instructions Will Be Listed Below (If Applicable).  Echocardiogram An echocardiogram, or echocardiography, uses sound waves (ultrasound) to produce an image of  your heart. The echocardiogram is simple, painless, obtained within a short period of time, and offers valuable information to your health care provider. The images from an echocardiogram can provide information such as:  Evidence of coronary artery disease (CAD).  Heart size.  Heart muscle function.  Heart valve function.  Aneurysm detection.  Evidence of a past heart attack.  Fluid buildup around the heart.  Heart muscle thickening.  Assess heart valve function.  Tell a health care provider about:  Any allergies you have.  All medicines you are taking, including vitamins, herbs, eye drops, creams, and over-the-counter medicines.  Any problems you or family members have had with anesthetic medicines.  Any blood disorders you have.  Any surgeries you have had.  Any medical conditions you have.  Whether you are pregnant or may be pregnant. What happens before the procedure? No special preparation is needed. Eat and drink normally. What happens during the procedure?  In order to produce an image of your heart, gel will be applied to your chest and a wand-like tool (transducer) will be moved over your chest. The gel will help transmit the sound waves from the transducer. The sound waves will harmlessly bounce off your heart to allow the heart images to be captured in real-time motion. These images will then be recorded.  You may need an IV to receive a medicine that improves the quality of the pictures. What happens after the procedure? You may return to your normal schedule including diet, activities, and medicines, unless your health care provider tells you otherwise. This information is not intended to  replace advice given to you by your health care provider. Make sure you discuss any questions you have with your health care provider. Document Released: 03/14/2000 Document Revised: 11/03/2015 Document Reviewed: 11/22/2012 Elsevier Interactive Patient Education  2017  [employer].

## 2018-02-10 ENCOUNTER — Other Ambulatory Visit (HOSPITAL_COMMUNITY): Payer: Medicare Other

## 2018-02-16 ENCOUNTER — Other Ambulatory Visit: Payer: Self-pay

## 2018-02-16 ENCOUNTER — Ambulatory Visit (HOSPITAL_COMMUNITY): Payer: Medicare Other | Attending: Cardiology

## 2018-02-16 DIAGNOSIS — I350 Nonrheumatic aortic (valve) stenosis: Secondary | ICD-10-CM | POA: Diagnosis not present

## 2018-02-17 ENCOUNTER — Telehealth: Payer: Self-pay

## 2018-02-17 DIAGNOSIS — I359 Nonrheumatic aortic valve disorder, unspecified: Secondary | ICD-10-CM

## 2018-02-17 NOTE — Telephone Encounter (Signed)
Notes recorded by Sigurd Sosapp, Monte Bronder, RN on 02/17/2018 at 8:35 AM EST The patient has been notified of the result and verbalized understanding. All questions (if any) were answered. Sigurd SosMichael Omaya Nieland, RN 02/17/2018 8:35 AM

## 2018-02-17 NOTE — Telephone Encounter (Signed)
-----   Message from Allayne ButcherBrittainy M Simmons, [place] sent at 02/16/2018  7:10 PM EST ----- Ultrasound of heart shows normal pump function. The aortic valve is a bit stiff. This appears to be mild-moderate. Given lack of symptoms, we will just simply monitor w/ repeat echo every 1-2 years. Instruct pt to notify Dr. Eldridge DaceVaranasi if he develops any SOB, CP, syncope/ near syncope.

## 2018-03-05 DIAGNOSIS — H35373 Puckering of macula, bilateral: Secondary | ICD-10-CM | POA: Diagnosis not present

## 2018-03-05 DIAGNOSIS — H353231 Exudative age-related macular degeneration, bilateral, with active choroidal neovascularization: Secondary | ICD-10-CM | POA: Diagnosis not present

## 2018-03-05 DIAGNOSIS — H43813 Vitreous degeneration, bilateral: Secondary | ICD-10-CM | POA: Diagnosis not present

## 2018-04-09 DIAGNOSIS — H35373 Puckering of macula, bilateral: Secondary | ICD-10-CM | POA: Diagnosis not present

## 2018-04-09 DIAGNOSIS — H353231 Exudative age-related macular degeneration, bilateral, with active choroidal neovascularization: Secondary | ICD-10-CM | POA: Diagnosis not present

## 2018-04-09 DIAGNOSIS — H35423 Microcystoid degeneration of retina, bilateral: Secondary | ICD-10-CM | POA: Diagnosis not present

## 2018-04-09 DIAGNOSIS — H43813 Vitreous degeneration, bilateral: Secondary | ICD-10-CM | POA: Diagnosis not present

## 2018-05-18 DIAGNOSIS — H6123 Impacted cerumen, bilateral: Secondary | ICD-10-CM | POA: Diagnosis not present

## 2018-05-21 DIAGNOSIS — H43813 Vitreous degeneration, bilateral: Secondary | ICD-10-CM | POA: Diagnosis not present

## 2018-05-21 DIAGNOSIS — H353231 Exudative age-related macular degeneration, bilateral, with active choroidal neovascularization: Secondary | ICD-10-CM | POA: Diagnosis not present

## 2018-05-21 DIAGNOSIS — H35423 Microcystoid degeneration of retina, bilateral: Secondary | ICD-10-CM | POA: Diagnosis not present

## 2018-05-21 DIAGNOSIS — H35373 Puckering of macula, bilateral: Secondary | ICD-10-CM | POA: Diagnosis not present

## 2018-06-04 DIAGNOSIS — H903 Sensorineural hearing loss, bilateral: Secondary | ICD-10-CM | POA: Diagnosis not present

## 2018-06-30 DIAGNOSIS — H353211 Exudative age-related macular degeneration, right eye, with active choroidal neovascularization: Secondary | ICD-10-CM | POA: Diagnosis not present

## 2018-07-12 DIAGNOSIS — K649 Unspecified hemorrhoids: Secondary | ICD-10-CM | POA: Diagnosis not present

## 2018-07-12 DIAGNOSIS — E7800 Pure hypercholesterolemia, unspecified: Secondary | ICD-10-CM | POA: Diagnosis not present

## 2018-07-12 DIAGNOSIS — E1122 Type 2 diabetes mellitus with diabetic chronic kidney disease: Secondary | ICD-10-CM | POA: Diagnosis not present

## 2018-07-12 DIAGNOSIS — M549 Dorsalgia, unspecified: Secondary | ICD-10-CM | POA: Diagnosis not present

## 2018-07-12 DIAGNOSIS — Z0000 Encounter for general adult medical examination without abnormal findings: Secondary | ICD-10-CM | POA: Diagnosis not present

## 2018-07-12 DIAGNOSIS — R05 Cough: Secondary | ICD-10-CM | POA: Diagnosis not present

## 2018-07-12 DIAGNOSIS — G479 Sleep disorder, unspecified: Secondary | ICD-10-CM | POA: Diagnosis not present

## 2018-07-12 DIAGNOSIS — N183 Chronic kidney disease, stage 3 (moderate): Secondary | ICD-10-CM | POA: Diagnosis not present

## 2018-07-12 DIAGNOSIS — I10 Essential (primary) hypertension: Secondary | ICD-10-CM | POA: Diagnosis not present

## 2018-08-13 DIAGNOSIS — H353231 Exudative age-related macular degeneration, bilateral, with active choroidal neovascularization: Secondary | ICD-10-CM | POA: Diagnosis not present

## 2018-09-17 DIAGNOSIS — H353211 Exudative age-related macular degeneration, right eye, with active choroidal neovascularization: Secondary | ICD-10-CM | POA: Diagnosis not present

## 2018-09-17 DIAGNOSIS — H353231 Exudative age-related macular degeneration, bilateral, with active choroidal neovascularization: Secondary | ICD-10-CM | POA: Diagnosis not present

## 2018-11-05 DIAGNOSIS — H35423 Microcystoid degeneration of retina, bilateral: Secondary | ICD-10-CM | POA: Diagnosis not present

## 2018-11-05 DIAGNOSIS — H35373 Puckering of macula, bilateral: Secondary | ICD-10-CM | POA: Diagnosis not present

## 2018-11-05 DIAGNOSIS — H43813 Vitreous degeneration, bilateral: Secondary | ICD-10-CM | POA: Diagnosis not present

## 2018-11-05 DIAGNOSIS — H353231 Exudative age-related macular degeneration, bilateral, with active choroidal neovascularization: Secondary | ICD-10-CM | POA: Diagnosis not present

## 2018-12-17 DIAGNOSIS — H3581 Retinal edema: Secondary | ICD-10-CM | POA: Diagnosis not present

## 2018-12-17 DIAGNOSIS — N183 Chronic kidney disease, stage 3 (moderate): Secondary | ICD-10-CM | POA: Diagnosis not present

## 2018-12-17 DIAGNOSIS — E1122 Type 2 diabetes mellitus with diabetic chronic kidney disease: Secondary | ICD-10-CM | POA: Diagnosis not present

## 2018-12-17 DIAGNOSIS — H35373 Puckering of macula, bilateral: Secondary | ICD-10-CM | POA: Diagnosis not present

## 2018-12-17 DIAGNOSIS — H43391 Other vitreous opacities, right eye: Secondary | ICD-10-CM | POA: Diagnosis not present

## 2018-12-17 DIAGNOSIS — H353231 Exudative age-related macular degeneration, bilateral, with active choroidal neovascularization: Secondary | ICD-10-CM | POA: Diagnosis not present

## 2018-12-17 DIAGNOSIS — E7800 Pure hypercholesterolemia, unspecified: Secondary | ICD-10-CM | POA: Diagnosis not present

## 2018-12-17 DIAGNOSIS — I10 Essential (primary) hypertension: Secondary | ICD-10-CM | POA: Diagnosis not present

## 2018-12-21 DIAGNOSIS — Z23 Encounter for immunization: Secondary | ICD-10-CM | POA: Diagnosis not present

## 2019-01-13 DIAGNOSIS — I10 Essential (primary) hypertension: Secondary | ICD-10-CM | POA: Diagnosis not present

## 2019-01-13 DIAGNOSIS — E1122 Type 2 diabetes mellitus with diabetic chronic kidney disease: Secondary | ICD-10-CM | POA: Diagnosis not present

## 2019-01-13 DIAGNOSIS — G479 Sleep disorder, unspecified: Secondary | ICD-10-CM | POA: Diagnosis not present

## 2019-02-01 DIAGNOSIS — H35423 Microcystoid degeneration of retina, bilateral: Secondary | ICD-10-CM | POA: Diagnosis not present

## 2019-02-01 DIAGNOSIS — H43813 Vitreous degeneration, bilateral: Secondary | ICD-10-CM | POA: Diagnosis not present

## 2019-02-01 DIAGNOSIS — H353231 Exudative age-related macular degeneration, bilateral, with active choroidal neovascularization: Secondary | ICD-10-CM | POA: Diagnosis not present

## 2019-02-01 DIAGNOSIS — H35373 Puckering of macula, bilateral: Secondary | ICD-10-CM | POA: Diagnosis not present

## 2019-02-17 ENCOUNTER — Other Ambulatory Visit: Payer: Self-pay

## 2019-02-17 ENCOUNTER — Ambulatory Visit (HOSPITAL_COMMUNITY): Payer: Medicare Other | Attending: Cardiology

## 2019-02-17 DIAGNOSIS — I359 Nonrheumatic aortic valve disorder, unspecified: Secondary | ICD-10-CM | POA: Diagnosis not present

## 2019-02-18 ENCOUNTER — Telehealth: Payer: Self-pay

## 2019-02-18 NOTE — Telephone Encounter (Signed)
Notes recorded by Frederik Schmidt, RN on 02/18/2019 at 9:11 AM EST  The patient has been notified of the Echo result and verbalized understanding. All questions (if any) were answered.  Frederik Schmidt, RN 02/18/2019 9:11 AM

## 2019-02-18 NOTE — Telephone Encounter (Signed)
-----   Message from Consuelo Pandy, [place] sent at 02/17/2019  5:45 PM EST ----- Pump function normal. Aortic stenosis is mild. Stable from last study. General cardiologist will continue to follow.

## 2019-03-11 DIAGNOSIS — H353231 Exudative age-related macular degeneration, bilateral, with active choroidal neovascularization: Secondary | ICD-10-CM | POA: Diagnosis not present

## 2019-03-11 DIAGNOSIS — H35423 Microcystoid degeneration of retina, bilateral: Secondary | ICD-10-CM | POA: Diagnosis not present

## 2019-03-11 DIAGNOSIS — H43391 Other vitreous opacities, right eye: Secondary | ICD-10-CM | POA: Diagnosis not present

## 2019-03-11 DIAGNOSIS — H35373 Puckering of macula, bilateral: Secondary | ICD-10-CM | POA: Diagnosis not present

## 2019-03-16 ENCOUNTER — Ambulatory Visit: Payer: Medicare Other | Admitting: Interventional Cardiology

## 2019-04-09 ENCOUNTER — Ambulatory Visit: Payer: Medicare Other | Attending: Internal Medicine

## 2019-04-09 DIAGNOSIS — Z23 Encounter for immunization: Secondary | ICD-10-CM

## 2019-04-09 NOTE — Progress Notes (Signed)
   Covid-19 Vaccination Clinic  Name:  Chad York    MRN: [id number] DOB: [birth year]  04/09/2019  Mr. Chad York was observed post Covid-19 immunization for 15 minutes without incidence. He was provided with Vaccine Information Sheet and instruction to access the V-Safe system.   Mr. Chad York was instructed to call 911 with any severe reactions post vaccine: Marland Kitchen Difficulty breathing  . Swelling of your face and throat  . A fast heartbeat  . A bad rash all over your body  . Dizziness and weakness    Immunizations Administered    Name Date Dose VIS Date Route   Pfizer COVID-19 Vaccine 04/09/2019  1:53 PM 0.3 mL 03/11/2019 Intramuscular   Manufacturer: [employer], [employer]   Lot: [id number]   NDC: [address]-5

## 2019-04-27 DIAGNOSIS — H35373 Puckering of macula, bilateral: Secondary | ICD-10-CM | POA: Diagnosis not present

## 2019-04-27 DIAGNOSIS — H35423 Microcystoid degeneration of retina, bilateral: Secondary | ICD-10-CM | POA: Diagnosis not present

## 2019-04-27 DIAGNOSIS — H43813 Vitreous degeneration, bilateral: Secondary | ICD-10-CM | POA: Diagnosis not present

## 2019-04-27 DIAGNOSIS — H353231 Exudative age-related macular degeneration, bilateral, with active choroidal neovascularization: Secondary | ICD-10-CM | POA: Diagnosis not present

## 2019-04-30 ENCOUNTER — Ambulatory Visit: Payer: Medicare Other | Attending: Internal Medicine

## 2019-04-30 ENCOUNTER — Ambulatory Visit: Payer: Medicare Other

## 2019-04-30 DIAGNOSIS — Z23 Encounter for immunization: Secondary | ICD-10-CM | POA: Insufficient documentation

## 2019-04-30 NOTE — Progress Notes (Signed)
   Covid-19 Vaccination Clinic  Name:  THEOREN PALKA    MRN: [id number] DOB: Apr 01, 1927  04/30/2019  Mr. Stork was observed post Covid-19 immunization for 15 minutes without incidence. He was provided with Vaccine Information Sheet and instruction to access the V-Safe system.   Mr. Haile was instructed to call 911 with any severe reactions post vaccine: Marland Kitchen Difficulty breathing  . Swelling of your face and throat  . A fast heartbeat  . A bad rash all over your body  . Dizziness and weakness    Immunizations Administered    Name Date Dose VIS Date Route   Pfizer COVID-19 Vaccine 04/30/2019  1:31 PM 0.3 mL 03/11/2019 Intramuscular   Manufacturer: [employer], [employer]   Lot: [id number]   NDC: [address]-1

## 2019-05-23 DIAGNOSIS — S60222A Contusion of left hand, initial encounter: Secondary | ICD-10-CM | POA: Diagnosis not present

## 2019-05-23 DIAGNOSIS — M79642 Pain in left hand: Secondary | ICD-10-CM | POA: Diagnosis not present

## 2019-05-31 DIAGNOSIS — M79642 Pain in left hand: Secondary | ICD-10-CM | POA: Diagnosis not present

## 2019-06-03 DIAGNOSIS — M79642 Pain in left hand: Secondary | ICD-10-CM | POA: Diagnosis not present

## 2019-06-08 DIAGNOSIS — H35373 Puckering of macula, bilateral: Secondary | ICD-10-CM | POA: Diagnosis not present

## 2019-06-08 DIAGNOSIS — H43391 Other vitreous opacities, right eye: Secondary | ICD-10-CM | POA: Diagnosis not present

## 2019-06-08 DIAGNOSIS — H43813 Vitreous degeneration, bilateral: Secondary | ICD-10-CM | POA: Diagnosis not present

## 2019-06-08 DIAGNOSIS — H353231 Exudative age-related macular degeneration, bilateral, with active choroidal neovascularization: Secondary | ICD-10-CM | POA: Diagnosis not present

## 2019-06-13 DIAGNOSIS — M79642 Pain in left hand: Secondary | ICD-10-CM | POA: Diagnosis not present

## 2019-06-27 DIAGNOSIS — M79642 Pain in left hand: Secondary | ICD-10-CM | POA: Diagnosis not present

## 2019-07-20 DIAGNOSIS — H43813 Vitreous degeneration, bilateral: Secondary | ICD-10-CM | POA: Diagnosis not present

## 2019-07-20 DIAGNOSIS — H35373 Puckering of macula, bilateral: Secondary | ICD-10-CM | POA: Diagnosis not present

## 2019-07-20 DIAGNOSIS — H35423 Microcystoid degeneration of retina, bilateral: Secondary | ICD-10-CM | POA: Diagnosis not present

## 2019-07-20 DIAGNOSIS — H353231 Exudative age-related macular degeneration, bilateral, with active choroidal neovascularization: Secondary | ICD-10-CM | POA: Diagnosis not present

## 2019-08-05 DIAGNOSIS — E1122 Type 2 diabetes mellitus with diabetic chronic kidney disease: Secondary | ICD-10-CM | POA: Diagnosis not present

## 2019-08-05 DIAGNOSIS — I10 Essential (primary) hypertension: Secondary | ICD-10-CM | POA: Diagnosis not present

## 2019-08-05 DIAGNOSIS — E7800 Pure hypercholesterolemia, unspecified: Secondary | ICD-10-CM | POA: Diagnosis not present

## 2019-08-08 DIAGNOSIS — Z0000 Encounter for general adult medical examination without abnormal findings: Secondary | ICD-10-CM | POA: Diagnosis not present

## 2019-08-08 DIAGNOSIS — N183 Chronic kidney disease, stage 3 unspecified: Secondary | ICD-10-CM | POA: Diagnosis not present

## 2019-08-08 DIAGNOSIS — E1122 Type 2 diabetes mellitus with diabetic chronic kidney disease: Secondary | ICD-10-CM | POA: Diagnosis not present

## 2019-08-08 DIAGNOSIS — E7800 Pure hypercholesterolemia, unspecified: Secondary | ICD-10-CM | POA: Diagnosis not present

## 2019-08-08 DIAGNOSIS — G479 Sleep disorder, unspecified: Secondary | ICD-10-CM | POA: Diagnosis not present

## 2019-08-08 DIAGNOSIS — I10 Essential (primary) hypertension: Secondary | ICD-10-CM | POA: Diagnosis not present

## 2019-08-18 ENCOUNTER — Ambulatory Visit: Payer: Medicare Other | Admitting: Interventional Cardiology

## 2019-08-21 NOTE — Progress Notes (Signed)
Cardiology Office Note   Date:  08/22/2019   ID:  MEREDITH MELLS, DOB 10/26/1927, MRN [id number]  PCP:  Lawerance Cruel, MD    No chief complaint on file.  Aortic stenosis  Wt Readings from Last 3 Encounters:  08/22/19 128 lb 12.8 oz (58.4 kg)  02/08/18 128 lb 12.8 oz (58.4 kg)  02/05/17 130 lb 6.4 oz (59.1 kg)       History of Present Illness: Chad York is a 86 y.o. male  whohas a history of mild aortic stenosis. His valvular lesion has remained stable over the past several years. Patient had an updated echocardiogram on 12/08/12 showing a peak gradient of 24 and a mean gradient of 13, essentially no change. The patient has normal left ventricular function with ejection fraction of 55-65%..   He had been followed by Dr. Mare Ferrari.    He has had PACs in the past as well. BP has been well controlled. He has not felt any palpitations. He had an echo today. BP always high in the MDs office. At home, it is much better.   In the past, he walked on the treadmill 5-7 days/week.   Echocardiogram showed: "11/17: Normal LV function, mild to modeate AS."  11/20 echo showed normal LV function with mild aortic stenosis.  Since the last visit, he has felt well.  He walks daily nearly on his treadmill, for 30 minutes at a time.   Denies : Chest pain. Dizziness. Leg edema. Nitroglycerin use. Orthopnea. Palpitations. Paroxysmal nocturnal dyspnea. Shortness of breath. Syncope.     Past Medical History:  Diagnosis Date  . Aortic stenosis   . Diabetes mellitus   . Dizzy spells    with near syncope at times  . H/O small bowel obstruction    RESOLVED W/O SURGERY  . Hyperlipidemia     Past Surgical History:  Procedure Laterality Date  . APPENDECTOMY  4174   COMPLICATED BY ABSCESS FORMATION  . CATARACT EXTRACTION  2004   LEFT EYE  . COLONOSCOPY  2004     Current Outpatient Medications  Medication Sig Dispense Refill  . amLODipine (NORVASC) 10 MG tablet Take  10 mg by mouth Daily.    Marland Kitchen atorvastatin (LIPITOR) 20 MG tablet Take 20 mg by mouth Daily.    . bacitracin 500 UNIT/GM ointment Apply 1 application topically as directed.    . Multiple Vitamin (MULTIVITAMIN) tablet Take 1 tablet by mouth daily.    . ramipril (ALTACE) 10 MG capsule Take 10 mg by mouth Twice daily.    Marland Kitchen zolpidem (AMBIEN) 10 MG tablet Take 10 mg by mouth. Take 1/2 tablet by mouth five times a week     No current facility-administered medications for this visit.    Allergies:   Ciprofloxacin and Sulfamethoxazole-trimethoprim    Social History:  The patient  reports that he quit smoking about 34 years ago. His smoking use included cigarettes. He has never used smokeless tobacco. He reports current alcohol use. He reports that he does not use drugs.   Family History:  The patient's family history includes Heart disease in his brother, brother, and father; Heart failure in his father; Stroke in his brother.    ROS:  Please see the history of present illness.   Otherwise, review of systems are positive for back pain on occasion.   All other systems are reviewed and negative.    PHYSICAL EXAM: VS:  BP 138/60   Pulse 94   Ht  5\' 3"  (1.6 m)   Wt 128 lb 12.8 oz (58.4 kg)   SpO2 98%   BMI 22.82 kg/m  , BMI Body mass index is 22.82 kg/m. GEN: Well nourished, well developed, in no acute distress  HEENT: normal  Neck: no JVD, carotid bruits, or masses Cardiac: RRR; no murmurs, rubs, or gallops,no edema  Respiratory:  clear to auscultation bilaterally, normal work of breathing GI: soft, nontender, nondistended, + BS MS: no deformity or atrophy  Skin: warm and dry, no rash Neuro:  Strength and sensation are intact Psych: euthymic mood, full affect   EKG:   The ekg ordered today demonstrates NSR, no SR changes   Recent Labs: No results found for requested labs within last 8760 hours.   Lipid Panel No results found for: CHOL, TRIG, HDL, CHOLHDL, VLDL, LDLCALC, LDLDIRECT    Other studies Reviewed: Additional studies/ records that were reviewed today with results demonstrating: LDL 53 in 2021.   ASSESSMENT AND PLAN:  1. Aortic stenosis: No sx of severe AS.  2020 echo reviewed. 2. Hypertensive heart disease: Typically, blood pressure readings have been high in the doctor's office and have been controlled at home. 3. Hyperlipidemia: Continue atorvastatin.  Whole food, plant-based diet recommended.  Lipids well controlled.  4. Diabetes: Was able to come off of medication several years ago and control this with diet.  A1C 5.9.   5. PACs: No palpitations noted.    Current medicines are reviewed at length with the patient today.  The patient concerns regarding his medicines were addressed.  The following changes have been made:  No change  Labs/ tests ordered today include:  No orders of the defined types were placed in this encounter.   Recommend 150 minutes/week of aerobic exercise Low fat, low carb, high fiber diet recommended  Disposition:   FU in 1 year   Signed, 2022, MD  08/22/2019 1:34 PM    [facility] [address] [place], [place], [place]  [place] Phone: [phone]; Fax: [phone]

## 2019-08-22 ENCOUNTER — Other Ambulatory Visit: Payer: Self-pay

## 2019-08-22 ENCOUNTER — Encounter: Payer: Self-pay | Admitting: Interventional Cardiology

## 2019-08-22 ENCOUNTER — Ambulatory Visit (INDEPENDENT_AMBULATORY_CARE_PROVIDER_SITE_OTHER): Payer: Medicare Other | Admitting: Interventional Cardiology

## 2019-08-22 VITALS — BP 138/60 | HR 94 | Ht 63.0 in | Wt 128.8 lb

## 2019-08-22 DIAGNOSIS — I491 Atrial premature depolarization: Secondary | ICD-10-CM | POA: Diagnosis not present

## 2019-08-22 DIAGNOSIS — E119 Type 2 diabetes mellitus without complications: Secondary | ICD-10-CM | POA: Diagnosis not present

## 2019-08-22 DIAGNOSIS — I119 Hypertensive heart disease without heart failure: Secondary | ICD-10-CM

## 2019-08-22 DIAGNOSIS — E782 Mixed hyperlipidemia: Secondary | ICD-10-CM | POA: Diagnosis not present

## 2019-08-22 DIAGNOSIS — I359 Nonrheumatic aortic valve disorder, unspecified: Secondary | ICD-10-CM

## 2019-08-22 NOTE — Patient Instructions (Signed)
Medication Instructions:  Your physician recommends that you continue on your current medications as directed. Please refer to the Current Medication list given to you today.  *If you need a refill on your cardiac medications before your next appointment, please call your pharmacy*   Lab Work: None ordered  If you have labs (blood work) drawn today and your tests are completely normal, you will receive your results only by: . MyChart Message (if you have MyChart) OR . A paper copy in the mail If you have any lab test that is abnormal or we need to change your treatment, we will call you to review the results.   Testing/Procedures: None ordered   Follow-Up: At [facility], you and your health needs are our priority.  As part of our continuing mission to provide you with exceptional heart care, we have created designated Provider Care Teams.  These Care Teams include your primary Cardiologist (physician) and Advanced Practice Providers (APPs -  Physician Assistants and Nurse Practitioners) who all work together to provide you with the care you need, when you need it.  We recommend signing up for the patient portal called "MyChart".  Sign up information is provided on this After Visit Summary.  MyChart is used to connect with patients for Virtual Visits (Telemedicine).  Patients are able to view lab/test results, encounter notes, upcoming appointments, etc.  Non-urgent messages can be sent to your provider as well.   To learn more about what you can do with MyChart, go to [email or web address].    Your next appointment:   12 month(s)  The format for your next appointment:   In Person  Provider:   You may see Jayadeep Varanasi, MD or one of the following Advanced Practice Providers on your designated Care Team:    Dayna Dunn, PA-C  Michele Lenze, PA-C    Other Instructions  High-Fiber Diet Fiber, also called dietary fiber, is a type of carbohydrate that is found in fruits,  vegetables, whole grains, and beans. A high-fiber diet can have many health benefits. Your health care provider may recommend a high-fiber diet to help:  Prevent constipation. Fiber can make your bowel movements more regular.  Lower your cholesterol.  Relieve the following conditions: ? Swelling of veins in the anus (hemorrhoids). ? Swelling and irritation (inflammation) of specific areas of the digestive tract (uncomplicated diverticulosis). ? A problem of the large intestine (colon) that sometimes causes pain and diarrhea (irritable bowel syndrome, IBS).  Prevent overeating as part of a weight-loss plan.  Prevent heart disease, type 2 diabetes, and certain cancers. What is my plan? The recommended daily fiber intake in grams (g) includes:  38 g for men age 50 or younger.  30 g for men over age 50.  25 g for women age 50 or younger.  21 g for women over age 50. You can get the recommended daily intake of dietary fiber by:  Eating a variety of fruits, vegetables, grains, and beans.  Taking a fiber supplement, if it is not possible to get enough fiber through your diet. What do I need to know about a high-fiber diet?  It is better to get fiber through food sources rather than from fiber supplements. There is not a lot of research about how effective supplements are.  Always check the fiber content on the nutrition facts label of any prepackaged food. Look for foods that contain 5 g of fiber or more per serving.  Talk with a diet and nutrition specialist (  dietitian) if you have questions about specific foods that are recommended or not recommended for your medical condition, especially if those foods are not listed below.  Gradually increase how much fiber you consume. If you increase your intake of dietary fiber too quickly, you may have bloating, cramping, or gas.  Drink plenty of water. Water helps you to digest fiber. What are tips for following this plan?  Eat a wide  variety of high-fiber foods.  Make sure that half of the grains that you eat each day are whole grains.  Eat breads and cereals that are made with whole-grain flour instead of refined flour or white flour.  Eat brown rice, bulgur wheat, or millet instead of white rice.  Start the day with a breakfast that is high in fiber, such as a cereal that contains 5 g of fiber or more per serving.  Use beans in place of meat in soups, salads, and pasta dishes.  Eat high-fiber snacks, such as berries, raw vegetables, nuts, and popcorn.  Choose whole fruits and vegetables instead of processed forms like juice or sauce. What foods can I eat?  Fruits Berries. Pears. Apples. Oranges. Avocado. Prunes and raisins. Dried figs. Vegetables Sweet potatoes. Spinach. Kale. Artichokes. Cabbage. Broccoli. Cauliflower. Green peas. Carrots. Squash. Grains Whole-grain breads. Multigrain cereal. Oats and oatmeal. Brown rice. Barley. Bulgur wheat. Millet. Quinoa. Bran muffins. Popcorn. Rye wafer crackers. Meats and other proteins Navy, kidney, and pinto beans. Soybeans. Split peas. Lentils. Nuts and seeds. Dairy Fiber-fortified yogurt. Beverages Fiber-fortified soy milk. Fiber-fortified orange juice. Other foods Fiber bars. The items listed above may not be a complete list of recommended foods and beverages. Contact a dietitian for more options. What foods are not recommended? Fruits Fruit juice. Cooked, strained fruit. Vegetables Fried potatoes. Canned vegetables. Well-cooked vegetables. Grains White bread. Pasta made with refined flour. White rice. Meats and other proteins Fatty cuts of meat. Fried chicken or fried fish. Dairy Milk. Yogurt. Cream cheese. Sour cream. Fats and oils Butters. Beverages Soft drinks. Other foods Cakes and pastries. The items listed above may not be a complete list of foods and beverages to avoid. Contact a dietitian for more information. Summary  Fiber is a type of  carbohydrate. It is found in fruits, vegetables, whole grains, and beans.  There are many health benefits of eating a high-fiber diet, such as preventing constipation, lowering blood cholesterol, helping with weight loss, and reducing your risk of heart disease, diabetes, and certain cancers.  Gradually increase your intake of fiber. Increasing too fast can result in cramping, bloating, and gas. Drink plenty of water while you increase your fiber.  The best sources of fiber include whole fruits and vegetables, whole grains, nuts, seeds, and beans. This information is not intended to replace advice given to you by your health care provider. Make sure you discuss any questions you have with your health care provider. Document Revised: 01/19/2017 Document Reviewed: 01/19/2017 Elsevier Patient Education  2020 Elsevier Inc.   

## 2019-08-31 DIAGNOSIS — H353231 Exudative age-related macular degeneration, bilateral, with active choroidal neovascularization: Secondary | ICD-10-CM | POA: Diagnosis not present

## 2019-10-12 DIAGNOSIS — H35423 Microcystoid degeneration of retina, bilateral: Secondary | ICD-10-CM | POA: Diagnosis not present

## 2019-10-12 DIAGNOSIS — H353231 Exudative age-related macular degeneration, bilateral, with active choroidal neovascularization: Secondary | ICD-10-CM | POA: Diagnosis not present

## 2019-10-12 DIAGNOSIS — H35373 Puckering of macula, bilateral: Secondary | ICD-10-CM | POA: Diagnosis not present

## 2019-10-12 DIAGNOSIS — H43813 Vitreous degeneration, bilateral: Secondary | ICD-10-CM | POA: Diagnosis not present

## 2019-11-03 DIAGNOSIS — M25562 Pain in left knee: Secondary | ICD-10-CM | POA: Diagnosis not present

## 2019-11-09 DIAGNOSIS — M65322 Trigger finger, left index finger: Secondary | ICD-10-CM | POA: Diagnosis not present

## 2019-11-09 DIAGNOSIS — M79642 Pain in left hand: Secondary | ICD-10-CM | POA: Diagnosis not present

## 2019-11-22 DIAGNOSIS — H43391 Other vitreous opacities, right eye: Secondary | ICD-10-CM | POA: Diagnosis not present

## 2019-11-22 DIAGNOSIS — H353231 Exudative age-related macular degeneration, bilateral, with active choroidal neovascularization: Secondary | ICD-10-CM | POA: Diagnosis not present

## 2019-11-22 DIAGNOSIS — H35371 Puckering of macula, right eye: Secondary | ICD-10-CM | POA: Diagnosis not present

## 2019-11-22 DIAGNOSIS — H43813 Vitreous degeneration, bilateral: Secondary | ICD-10-CM | POA: Diagnosis not present

## 2019-12-28 DIAGNOSIS — H353231 Exudative age-related macular degeneration, bilateral, with active choroidal neovascularization: Secondary | ICD-10-CM | POA: Diagnosis not present

## 2019-12-28 DIAGNOSIS — H43391 Other vitreous opacities, right eye: Secondary | ICD-10-CM | POA: Diagnosis not present

## 2019-12-28 DIAGNOSIS — H35371 Puckering of macula, right eye: Secondary | ICD-10-CM | POA: Diagnosis not present

## 2019-12-28 DIAGNOSIS — H43813 Vitreous degeneration, bilateral: Secondary | ICD-10-CM | POA: Diagnosis not present

## 2020-01-02 DIAGNOSIS — Z23 Encounter for immunization: Secondary | ICD-10-CM | POA: Diagnosis not present

## 2020-01-03 DIAGNOSIS — M65322 Trigger finger, left index finger: Secondary | ICD-10-CM | POA: Diagnosis not present

## 2020-01-03 DIAGNOSIS — M79642 Pain in left hand: Secondary | ICD-10-CM | POA: Diagnosis not present

## 2020-01-09 DIAGNOSIS — Z23 Encounter for immunization: Secondary | ICD-10-CM | POA: Diagnosis not present

## 2020-02-01 DIAGNOSIS — H353231 Exudative age-related macular degeneration, bilateral, with active choroidal neovascularization: Secondary | ICD-10-CM | POA: Diagnosis not present

## 2020-02-01 DIAGNOSIS — H35423 Microcystoid degeneration of retina, bilateral: Secondary | ICD-10-CM | POA: Diagnosis not present

## 2020-02-01 DIAGNOSIS — H35373 Puckering of macula, bilateral: Secondary | ICD-10-CM | POA: Diagnosis not present

## 2020-02-01 DIAGNOSIS — H43391 Other vitreous opacities, right eye: Secondary | ICD-10-CM | POA: Diagnosis not present

## 2020-02-08 DIAGNOSIS — E1122 Type 2 diabetes mellitus with diabetic chronic kidney disease: Secondary | ICD-10-CM | POA: Diagnosis not present

## 2020-02-08 DIAGNOSIS — G479 Sleep disorder, unspecified: Secondary | ICD-10-CM | POA: Diagnosis not present

## 2020-02-08 DIAGNOSIS — N183 Chronic kidney disease, stage 3 unspecified: Secondary | ICD-10-CM | POA: Diagnosis not present

## 2020-03-06 DIAGNOSIS — H43391 Other vitreous opacities, right eye: Secondary | ICD-10-CM | POA: Diagnosis not present

## 2020-03-06 DIAGNOSIS — H353231 Exudative age-related macular degeneration, bilateral, with active choroidal neovascularization: Secondary | ICD-10-CM | POA: Diagnosis not present

## 2020-03-06 DIAGNOSIS — H35423 Microcystoid degeneration of retina, bilateral: Secondary | ICD-10-CM | POA: Diagnosis not present

## 2020-03-06 DIAGNOSIS — H35371 Puckering of macula, right eye: Secondary | ICD-10-CM | POA: Diagnosis not present

## 2020-04-11 DIAGNOSIS — H43813 Vitreous degeneration, bilateral: Secondary | ICD-10-CM | POA: Diagnosis not present

## 2020-04-11 DIAGNOSIS — H43391 Other vitreous opacities, right eye: Secondary | ICD-10-CM | POA: Diagnosis not present

## 2020-04-11 DIAGNOSIS — H353231 Exudative age-related macular degeneration, bilateral, with active choroidal neovascularization: Secondary | ICD-10-CM | POA: Diagnosis not present

## 2020-04-11 DIAGNOSIS — H35371 Puckering of macula, right eye: Secondary | ICD-10-CM | POA: Diagnosis not present

## 2020-05-09 DIAGNOSIS — H353231 Exudative age-related macular degeneration, bilateral, with active choroidal neovascularization: Secondary | ICD-10-CM | POA: Diagnosis not present

## 2020-05-09 DIAGNOSIS — H35423 Microcystoid degeneration of retina, bilateral: Secondary | ICD-10-CM | POA: Diagnosis not present

## 2020-05-09 DIAGNOSIS — H35371 Puckering of macula, right eye: Secondary | ICD-10-CM | POA: Diagnosis not present

## 2020-05-09 DIAGNOSIS — H43813 Vitreous degeneration, bilateral: Secondary | ICD-10-CM | POA: Diagnosis not present

## 2020-06-06 DIAGNOSIS — H35371 Puckering of macula, right eye: Secondary | ICD-10-CM | POA: Diagnosis not present

## 2020-06-06 DIAGNOSIS — H43813 Vitreous degeneration, bilateral: Secondary | ICD-10-CM | POA: Diagnosis not present

## 2020-06-06 DIAGNOSIS — H35423 Microcystoid degeneration of retina, bilateral: Secondary | ICD-10-CM | POA: Diagnosis not present

## 2020-06-06 DIAGNOSIS — H353231 Exudative age-related macular degeneration, bilateral, with active choroidal neovascularization: Secondary | ICD-10-CM | POA: Diagnosis not present

## 2020-06-29 DIAGNOSIS — Z23 Encounter for immunization: Secondary | ICD-10-CM | POA: Diagnosis not present

## 2020-07-04 DIAGNOSIS — H35423 Microcystoid degeneration of retina, bilateral: Secondary | ICD-10-CM | POA: Diagnosis not present

## 2020-07-04 DIAGNOSIS — H35371 Puckering of macula, right eye: Secondary | ICD-10-CM | POA: Diagnosis not present

## 2020-07-04 DIAGNOSIS — H43813 Vitreous degeneration, bilateral: Secondary | ICD-10-CM | POA: Diagnosis not present

## 2020-07-04 DIAGNOSIS — H353231 Exudative age-related macular degeneration, bilateral, with active choroidal neovascularization: Secondary | ICD-10-CM | POA: Diagnosis not present

## 2020-08-01 DIAGNOSIS — H43813 Vitreous degeneration, bilateral: Secondary | ICD-10-CM | POA: Diagnosis not present

## 2020-08-01 DIAGNOSIS — H35371 Puckering of macula, right eye: Secondary | ICD-10-CM | POA: Diagnosis not present

## 2020-08-01 DIAGNOSIS — H353231 Exudative age-related macular degeneration, bilateral, with active choroidal neovascularization: Secondary | ICD-10-CM | POA: Diagnosis not present

## 2020-08-01 DIAGNOSIS — H3581 Retinal edema: Secondary | ICD-10-CM | POA: Diagnosis not present

## 2020-08-24 DIAGNOSIS — Z0000 Encounter for general adult medical examination without abnormal findings: Secondary | ICD-10-CM | POA: Diagnosis not present

## 2020-08-28 DIAGNOSIS — N183 Chronic kidney disease, stage 3 unspecified: Secondary | ICD-10-CM | POA: Diagnosis not present

## 2020-08-28 DIAGNOSIS — E1122 Type 2 diabetes mellitus with diabetic chronic kidney disease: Secondary | ICD-10-CM | POA: Diagnosis not present

## 2020-08-28 DIAGNOSIS — I10 Essential (primary) hypertension: Secondary | ICD-10-CM | POA: Diagnosis not present

## 2020-08-28 DIAGNOSIS — G479 Sleep disorder, unspecified: Secondary | ICD-10-CM | POA: Diagnosis not present

## 2020-08-28 DIAGNOSIS — E7800 Pure hypercholesterolemia, unspecified: Secondary | ICD-10-CM | POA: Diagnosis not present

## 2020-08-29 DIAGNOSIS — H353231 Exudative age-related macular degeneration, bilateral, with active choroidal neovascularization: Secondary | ICD-10-CM | POA: Diagnosis not present

## 2020-08-29 DIAGNOSIS — H3581 Retinal edema: Secondary | ICD-10-CM | POA: Diagnosis not present

## 2020-08-29 DIAGNOSIS — H43813 Vitreous degeneration, bilateral: Secondary | ICD-10-CM | POA: Diagnosis not present

## 2020-08-29 DIAGNOSIS — H35371 Puckering of macula, right eye: Secondary | ICD-10-CM | POA: Diagnosis not present

## 2020-10-10 DIAGNOSIS — H43813 Vitreous degeneration, bilateral: Secondary | ICD-10-CM | POA: Diagnosis not present

## 2020-10-10 DIAGNOSIS — H35371 Puckering of macula, right eye: Secondary | ICD-10-CM | POA: Diagnosis not present

## 2020-10-10 DIAGNOSIS — H353231 Exudative age-related macular degeneration, bilateral, with active choroidal neovascularization: Secondary | ICD-10-CM | POA: Diagnosis not present

## 2020-10-10 DIAGNOSIS — H3581 Retinal edema: Secondary | ICD-10-CM | POA: Diagnosis not present

## 2020-10-15 ENCOUNTER — Telehealth: Payer: Self-pay | Admitting: Interventional Cardiology

## 2020-10-15 NOTE — Telephone Encounter (Signed)
Spoke with pt and made him aware that Dr. Eldridge Dace wanted to see him back in 1 year and do EKG same day.  Pt agreeable to plan but can't get here at 8:40am as offered previously.  Scheduled pt to come in on 12/6 and added him to wait list.  Pt appreciative for call.

## 2020-10-15 NOTE — Telephone Encounter (Signed)
Chad York is calling stating Dr. Eldridge Dace advised him to call to schedule an EKG for this year. I advised him it looks like Dr. Eldridge Dace is wanting to see him for a 1 year follow up. Pt requested message be sent to see if Dr. Eldridge Dace would like him to come in for just an EKG first then schedule. Please advise.

## 2020-11-15 DIAGNOSIS — M1712 Unilateral primary osteoarthritis, left knee: Secondary | ICD-10-CM | POA: Diagnosis not present

## 2020-11-21 DIAGNOSIS — H35371 Puckering of macula, right eye: Secondary | ICD-10-CM | POA: Diagnosis not present

## 2020-11-21 DIAGNOSIS — H3581 Retinal edema: Secondary | ICD-10-CM | POA: Diagnosis not present

## 2020-11-21 DIAGNOSIS — H353231 Exudative age-related macular degeneration, bilateral, with active choroidal neovascularization: Secondary | ICD-10-CM | POA: Diagnosis not present

## 2020-11-21 DIAGNOSIS — H35423 Microcystoid degeneration of retina, bilateral: Secondary | ICD-10-CM | POA: Diagnosis not present

## 2020-12-04 DIAGNOSIS — Z23 Encounter for immunization: Secondary | ICD-10-CM | POA: Diagnosis not present

## 2021-01-09 DIAGNOSIS — H3581 Retinal edema: Secondary | ICD-10-CM | POA: Diagnosis not present

## 2021-01-09 DIAGNOSIS — H353231 Exudative age-related macular degeneration, bilateral, with active choroidal neovascularization: Secondary | ICD-10-CM | POA: Diagnosis not present

## 2021-01-09 DIAGNOSIS — H35371 Puckering of macula, right eye: Secondary | ICD-10-CM | POA: Diagnosis not present

## 2021-01-09 DIAGNOSIS — H35423 Microcystoid degeneration of retina, bilateral: Secondary | ICD-10-CM | POA: Diagnosis not present

## 2021-02-06 DIAGNOSIS — G479 Sleep disorder, unspecified: Secondary | ICD-10-CM | POA: Diagnosis not present

## 2021-02-06 DIAGNOSIS — I10 Essential (primary) hypertension: Secondary | ICD-10-CM | POA: Diagnosis not present

## 2021-02-06 DIAGNOSIS — E1122 Type 2 diabetes mellitus with diabetic chronic kidney disease: Secondary | ICD-10-CM | POA: Diagnosis not present

## 2021-02-06 NOTE — Progress Notes (Signed)
Cardiology Office Note   Date:  02/07/2021   ID:  Chad York, DOB [birth year], MRN [id number]  PCP:  Daisy Floro, MD    No chief complaint on file.  Aortic stenosis  Wt Readings from Last 3 Encounters:  02/07/21 129 lb 12.8 oz (58.9 kg)  08/22/19 128 lb 12.8 oz (58.4 kg)  02/08/18 128 lb 12.8 oz (58.4 kg)       History of Present Illness: Chad York is a 87 y.o. male  who has a history of mild aortic stenosis. His valvular lesion has remained stable over the past several years. Patient had an updated echocardiogram on 12/08/12 showing a peak gradient of 24 and a mean gradient of 13, essentially no change. The patient has normal left ventricular function with ejection fraction of 55-65%..     He had been followed by Dr. Patty Sermons.     He has had PACs in the past as well.  BP has been well controlled.  He has not felt any palpitations.  He had an echo today.  BP always high in the MDs office.  At home, it is much better.     In the past, he walked on the treadmill 5-7 days/week.    Echocardiogram showed: "11/17: Normal LV function, mild to modeate AS."   11/20 echo showed normal LV function with mild aortic stenosis.   Denies : Chest pain. Dizziness. Leg edema. Nitroglycerin use. Orthopnea. Palpitations. Paroxysmal nocturnal dyspnea. Shortness of breath. Syncope.    Walks daily, 20 minutes.  Had a shot in the knee.   Lives with wife.  Her health is good.  Kids in [place] and [place].     Past Medical History:  Diagnosis Date   Aortic stenosis    Diabetes mellitus    Dizzy spells    with near syncope at times   H/O small bowel obstruction    RESOLVED W/O SURGERY   Hyperlipidemia     Past Surgical History:  Procedure Laterality Date   APPENDECTOMY  2005   COMPLICATED BY ABSCESS FORMATION   CATARACT EXTRACTION  2004   LEFT EYE   COLONOSCOPY  2004     Current Outpatient Medications  Medication Sig Dispense Refill   amLODipine (NORVASC) 10 MG  tablet Take 10 mg by mouth Daily.     atorvastatin (LIPITOR) 20 MG tablet Take 20 mg by mouth Daily.     bacitracin 500 UNIT/GM ointment Apply 1 application topically as directed.     Multiple Vitamin (MULTIVITAMIN) tablet Take 1 tablet by mouth daily.     ramipril (ALTACE) 10 MG capsule Take 10 mg by mouth Twice daily.     zolpidem (AMBIEN) 10 MG tablet Take 10 mg by mouth. Take 1/2 tablet by mouth five times a week     No current facility-administered medications for this visit.    Allergies:   Metronidazole, Ciprofloxacin, and Sulfamethoxazole-trimethoprim    Social History:  The patient  reports that he quit smoking about 35 years ago. His smoking use included cigarettes. He has never used smokeless tobacco. He reports current alcohol use. He reports that he does not use drugs.   Family History:  The patient's family history includes Heart disease in his brother, brother, and father; Heart failure in his father; Stroke in his brother.    ROS:  Please see the history of present illness.   Otherwise, review of systems are positive for visual issues- AMD.   All other systems  are reviewed and negative.    PHYSICAL EXAM: VS:  BP (!) 150/64   Pulse 91   Ht 5\' 3"  (1.6 m)   Wt 129 lb 12.8 oz (58.9 kg)   SpO2 97%   BMI 22.99 kg/m  , BMI Body mass index is 22.99 kg/m. GEN: Well nourished, well developed, in no acute distress HEENT: normal Neck: no JVD, carotid bruits, or masses Cardiac: RRR; 2/6 systolic murmurs, rubs, no gallops,no edema  Respiratory:  clear to auscultation bilaterally, normal work of breathing GI: soft, nontender, nondistended, + BS MS: no deformity or atrophy Skin: warm and dry, no rash Neuro:  Strength and sensation are intact Psych: euthymic mood, full affect   EKG:   The ekg ordered today demonstrates NSR with aberrant PACs   Recent Labs: No results found for requested labs within last 8760 hours.   Lipid Panel No results found for: CHOL, TRIG, HDL,  CHOLHDL, VLDL, LDLCALC, LDLDIRECT   Other studies Reviewed: Additional studies/ records that were reviewed today with results demonstrating: labs reviewed- Cr 1.3, K 5.4.   ASSESSMENT AND PLAN:  Aortic stenosis: Mild in 2020. No CP, lightheadedness, fluid retention, syncope.  Hypertensive heart disease: High in the MDs office.  120s systolic at home.  Hyperlipidemia: Whole food, plant based diet. LDL 51. Continue atorvastatin.  Diabetes: High fiber diet.  Exercise safely.  PACs: asymptomatic. Noted on today's ECG   Current medicines are reviewed at length with the patient today.  The patient concerns regarding his medicines were addressed.  The following changes have been made:  No change  Labs/ tests ordered today include:  No orders of the defined types were placed in this encounter.   Recommend 150 minutes/week of aerobic exercise Low fat, low carb, high fiber diet recommended  Disposition:   FU in 1 year   Signed, 2021, MD  02/07/2021 2:31 PM    [facility] [address] [place], [place], [place]  [place] Phone: [phone]; Fax: [phone]

## 2021-02-07 ENCOUNTER — Ambulatory Visit (INDEPENDENT_AMBULATORY_CARE_PROVIDER_SITE_OTHER): Payer: Medicare Other | Admitting: Interventional Cardiology

## 2021-02-07 ENCOUNTER — Other Ambulatory Visit: Payer: Self-pay

## 2021-02-07 ENCOUNTER — Encounter: Payer: Self-pay | Admitting: Interventional Cardiology

## 2021-02-07 VITALS — BP 150/64 | HR 91 | Ht 63.0 in | Wt 129.8 lb

## 2021-02-07 DIAGNOSIS — I119 Hypertensive heart disease without heart failure: Secondary | ICD-10-CM | POA: Diagnosis not present

## 2021-02-07 DIAGNOSIS — I491 Atrial premature depolarization: Secondary | ICD-10-CM

## 2021-02-07 DIAGNOSIS — I359 Nonrheumatic aortic valve disorder, unspecified: Secondary | ICD-10-CM

## 2021-02-07 DIAGNOSIS — E782 Mixed hyperlipidemia: Secondary | ICD-10-CM | POA: Diagnosis not present

## 2021-02-07 DIAGNOSIS — E119 Type 2 diabetes mellitus without complications: Secondary | ICD-10-CM | POA: Diagnosis not present

## 2021-02-07 NOTE — Patient Instructions (Signed)

## 2021-02-11 DIAGNOSIS — H10502 Unspecified blepharoconjunctivitis, left eye: Secondary | ICD-10-CM | POA: Diagnosis not present

## 2021-02-27 DIAGNOSIS — H35371 Puckering of macula, right eye: Secondary | ICD-10-CM | POA: Diagnosis not present

## 2021-02-27 DIAGNOSIS — H353231 Exudative age-related macular degeneration, bilateral, with active choroidal neovascularization: Secondary | ICD-10-CM | POA: Diagnosis not present

## 2021-02-27 DIAGNOSIS — H43813 Vitreous degeneration, bilateral: Secondary | ICD-10-CM | POA: Diagnosis not present

## 2021-02-27 DIAGNOSIS — H35423 Microcystoid degeneration of retina, bilateral: Secondary | ICD-10-CM | POA: Diagnosis not present

## 2021-03-05 ENCOUNTER — Ambulatory Visit: Payer: Medicare Other | Admitting: Interventional Cardiology

## 2021-04-10 DIAGNOSIS — H35371 Puckering of macula, right eye: Secondary | ICD-10-CM | POA: Diagnosis not present

## 2021-04-10 DIAGNOSIS — H43813 Vitreous degeneration, bilateral: Secondary | ICD-10-CM | POA: Diagnosis not present

## 2021-04-10 DIAGNOSIS — H353231 Exudative age-related macular degeneration, bilateral, with active choroidal neovascularization: Secondary | ICD-10-CM | POA: Diagnosis not present

## 2021-04-10 DIAGNOSIS — H35423 Microcystoid degeneration of retina, bilateral: Secondary | ICD-10-CM | POA: Diagnosis not present

## 2021-05-29 DIAGNOSIS — H43813 Vitreous degeneration, bilateral: Secondary | ICD-10-CM | POA: Diagnosis not present

## 2021-05-29 DIAGNOSIS — H35371 Puckering of macula, right eye: Secondary | ICD-10-CM | POA: Diagnosis not present

## 2021-05-29 DIAGNOSIS — H35423 Microcystoid degeneration of retina, bilateral: Secondary | ICD-10-CM | POA: Diagnosis not present

## 2021-05-29 DIAGNOSIS — H353231 Exudative age-related macular degeneration, bilateral, with active choroidal neovascularization: Secondary | ICD-10-CM | POA: Diagnosis not present

## 2021-07-24 DIAGNOSIS — H353231 Exudative age-related macular degeneration, bilateral, with active choroidal neovascularization: Secondary | ICD-10-CM | POA: Diagnosis not present

## 2021-07-24 DIAGNOSIS — H43813 Vitreous degeneration, bilateral: Secondary | ICD-10-CM | POA: Diagnosis not present

## 2021-07-24 DIAGNOSIS — H43391 Other vitreous opacities, right eye: Secondary | ICD-10-CM | POA: Diagnosis not present

## 2021-07-24 DIAGNOSIS — H35371 Puckering of macula, right eye: Secondary | ICD-10-CM | POA: Diagnosis not present

## 2021-08-19 ENCOUNTER — Encounter: Payer: Self-pay | Admitting: Internal Medicine

## 2021-08-19 ENCOUNTER — Ambulatory Visit (INDEPENDENT_AMBULATORY_CARE_PROVIDER_SITE_OTHER): Payer: Medicare Other | Admitting: Internal Medicine

## 2021-08-19 ENCOUNTER — Telehealth: Payer: Self-pay | Admitting: Interventional Cardiology

## 2021-08-19 VITALS — BP 148/70 | HR 93 | Ht 63.0 in | Wt 131.8 lb

## 2021-08-19 DIAGNOSIS — E1169 Type 2 diabetes mellitus with other specified complication: Secondary | ICD-10-CM | POA: Diagnosis not present

## 2021-08-19 DIAGNOSIS — N183 Chronic kidney disease, stage 3 unspecified: Secondary | ICD-10-CM

## 2021-08-19 DIAGNOSIS — I359 Nonrheumatic aortic valve disorder, unspecified: Secondary | ICD-10-CM

## 2021-08-19 DIAGNOSIS — I152 Hypertension secondary to endocrine disorders: Secondary | ICD-10-CM | POA: Diagnosis not present

## 2021-08-19 DIAGNOSIS — E785 Hyperlipidemia, unspecified: Secondary | ICD-10-CM | POA: Diagnosis not present

## 2021-08-19 DIAGNOSIS — R072 Precordial pain: Secondary | ICD-10-CM

## 2021-08-19 DIAGNOSIS — E1159 Type 2 diabetes mellitus with other circulatory complications: Secondary | ICD-10-CM

## 2021-08-19 DIAGNOSIS — E119 Type 2 diabetes mellitus without complications: Secondary | ICD-10-CM | POA: Diagnosis not present

## 2021-08-19 LAB — BASIC METABOLIC PANEL
BUN/Creatinine Ratio: 12 (ref 10–24)
BUN: 15 mg/dL (ref 10–36)
CO2: 24 mmol/L (ref 20–29)
Calcium: 9.2 mg/dL (ref 8.6–10.2)
Chloride: 105 mmol/L (ref 96–106)
Creatinine, Ser: 1.24 mg/dL (ref 0.76–1.27)
Glucose: 105 mg/dL — ABNORMAL HIGH (ref 70–99)
Potassium: 4.3 mmol/L (ref 3.5–5.2)
Sodium: 141 mmol/L (ref 134–144)
eGFR: 55 mL/min/{1.73_m2} — ABNORMAL LOW (ref >59)

## 2021-08-19 MED ORDER — METOPROLOL SUCCINATE ER 25 MG PO TB24: 12.5000 mg | ORAL_TABLET | Freq: Every day | ORAL | 3 refills | Status: DC

## 2021-08-19 MED ORDER — NITROGLYCERIN 0.4 MG SL SUBL: 0.4000 mg | SUBLINGUAL_TABLET | SUBLINGUAL | 6 refills | Status: DC | PRN

## 2021-08-19 MED ORDER — ISOSORBIDE MONONITRATE ER 30 MG PO TB24: 30.0000 mg | ORAL_TABLET | Freq: Every day | ORAL | 3 refills | Status: DC

## 2021-08-19 NOTE — Progress Notes (Signed)
Cardiology Office Note:    Date:  08/19/2021   ID:  Chad York, DOB Aug 25, 1927, MRN [id number]  PCP:  Lawerance Cruel, MD   [facility] Cardiologist:  Lenna Sciara, MD Referring MD: Lawerance Cruel, MD   Chief Complaint/Reason for Referral:  Chest pain  ASSESSMENT:    1. Precordial pain   2. Aortic valve disease   3. Type 2 diabetes mellitus without complication, without long-term current use of insulin ([place])   4. Hypertension associated with diabetes ([place])   5. Hyperlipidemia associated with type 2 diabetes mellitus ([facility])   6. Stage 3 chronic kidney disease, unspecified whether stage 3a or 3b CKD ([place])     PLAN:    In order of problems listed above: 1.  Chest pain: His exam does not suggest that he has developed severe aortic stenosis however I will obtain an echocardiogram to evaluate further.  He is already on amlodipine for an antianginal agent.  I will start Imdur 30 mg at bedtime and Toprol-XL 12.5 mg at bedtime as well as as needed nitroglycerin.  While his heart rate is in the 50s I think he will be able to tolerate low-dose Toprol at bedtime.  I did tell him to call us if he develops any lightheadedness and we can adjust his medications.  His EKG today does demonstrate occasional PVCs but I do not think his symptoms are related to this.  We will check a BMP today.  If his creatinine is reasonable then I will refer him for coronary CTA.  I certainly did speak to the patient about his advanced age and the need to carefully weigh his risk of invasive procedures against this.  However he is a quite remarkable 88 year old gentleman and it may make sense to be a bit more aggressive in his situation.  However the patient's chest pain is not lifestyle limiting in the sense that it only occurs 15 minutes after he exerts himself which may be acceptable.  I will have him follow-up with the cardiology division in a few weeks time to see if the medications have helped  to determine future therapy. 2.  Aortic stenosis: We will obtain an echocardiogram to evaluate further.   3.  Hypertension: Blood pressure is above goal today.  We will start low-dose Toprol-XL and Imdur as detailed above. 4.  Hyperlipidemia: Continue current therapy.   Dispo:  Return in about 3 weeks (around 09/09/2021).      Medication Adjustments/Labs and Tests Ordered: Current medicines are reviewed at length with the patient today.  Concerns regarding medicines are outlined above.  The following changes have been made:    Labs/tests ordered: Orders Placed This Encounter  Procedures   Basic Metabolic Panel (BMET)   EKG 12-Lead   ECHOCARDIOGRAM COMPLETE    Medication Changes: Meds ordered this encounter  Medications   isosorbide mononitrate (IMDUR) 30 MG 24 hr tablet    Sig: Take 1 tablet (30 mg total) by mouth at bedtime.    Dispense:  90 tablet    Refill:  3   metoprolol succinate (TOPROL XL) 25 MG 24 hr tablet    Sig: Take 0.5 tablets (12.5 mg total) by mouth at bedtime.    Dispense:  45 tablet    Refill:  3   nitroGLYCERIN (NITROSTAT) 0.4 MG SL tablet    Sig: Place 1 tablet (0.4 mg total) under the tongue every 5 (five) minutes as needed for chest pain.    Dispense:  25 tablet    Refill:  6     Current medicines are reviewed at length with the patient today.  The patient does not have concerns regarding medicines.   History of Present Illness:    FOCUSED PROBLEM LIST:   1.  Mild aortic stenosis on echocardiogram 2020 2.  Hypertension 3.  Hyperlipidemia 4.  T2DM, diet controlled 5.  CKD stage 3 (per patient report)  The patient is a 88 y.o. male with the indicated medical history here for for chest pain.  I am seeing the patient as the doctor of the day as the patient is normally cared for by Dr. Irish Lack.  The patient was last seen in November 2022.  He was doing well and at that time was able to walk on a treadmill regularly.  He denied any chest pain,  paroxysmal nocturnal dyspnea, exertional dyspnea, or nitroglycerin use.    The patient tells me that about in December he started developing chest pain with exertion.  He can walk about 15 minutes before he needs to stop.  The chest pains feels like a central chest pressure that does radiate to the left side of his chest at times.  There is no radiation down his arm.  He reliably is resolved with rest.  He does not get the pain when he goes up a flight of stairs however he can walk 15 minutes on flat ground without getting the pain.  He has never had the pain at rest.  He denies any presyncope or syncope.  He has had no palpitations, paroxysmal nocturnal dyspnea or orthopnea.  He has not required emergency room visit or hospitalization for any reason.     Current Medications: Current Meds  Medication Sig   amLODipine (NORVASC) 10 MG tablet Take 10 mg by mouth Daily.   Ascorbic Acid (VITAMIN C) 500 MG CAPS Take 1 capsule by mouth daily.   atorvastatin (LIPITOR) 20 MG tablet Take 20 mg by mouth Daily.   isosorbide mononitrate (IMDUR) 30 MG 24 hr tablet Take 1 tablet (30 mg total) by mouth at bedtime.   metoprolol succinate (TOPROL XL) 25 MG 24 hr tablet Take 0.5 tablets (12.5 mg total) by mouth at bedtime.   Multiple Vitamin (MULTIVITAMIN) tablet Take 1 tablet by mouth daily.   nitroGLYCERIN (NITROSTAT) 0.4 MG SL tablet Place 1 tablet (0.4 mg total) under the tongue every 5 (five) minutes as needed for chest pain.   ramipril (ALTACE) 10 MG capsule Take 10 mg by mouth Twice daily.   vitamin E 45 MG (100 UNITS) capsule Take 1 capsule by mouth daily.   zolpidem (AMBIEN) 10 MG tablet Take 10 mg by mouth. Take 1/2 tablet by mouth five times a week     Allergies:    Metronidazole, Ciprofloxacin, and Sulfamethoxazole-trimethoprim   Social History:   Social History   Tobacco Use   Smoking status: Former    Types: Cigarettes    Quit date: 03/31/1985    Years since quitting: 36.4   Smokeless  tobacco: Never  Vaping Use   Vaping Use: Never used  Substance Use Topics   Alcohol use: Yes    Comment: 4-6 DRINKS WEEKLY    Drug use: No     Family Hx: Family History  Problem Relation Age of Onset   Heart disease Father    Heart failure Father    Heart disease Brother        CABG   Stroke Brother    Heart  disease Brother      Review of Systems:   Please see the history of present illness.    All other systems reviewed and are negative.     EKGs/Labs/Other Test Reviewed:    EKG:  EKG performed November 2022 that I personally reviewed demonstrates sinus rhythm with fusion beats; EKG performed today that I personally reviewed demonstrates sinus rhythm with PVCs.  Prior CV studies: TTE 2020 demonstrates normal ejection fraction and mild aortic stenosis any significant other valvular abnormalities  Other studies Reviewed: Review of the additional studies/records demonstrates: CT abdomen pelvis 2005 demonstrates aortic atherosclerosis  Recent Labs: No results found for requested labs within last 8760 hours.   Recent Lipid Panel No results found for: CHOL, TRIG, HDL, LDLCALC, LDLDIRECT  Risk Assessment/Calculations:          Physical Exam:    VS:  BP (!) 148/70   Pulse 93   Ht 5\' 3"  (1.6 m)   Wt 131 lb 12.8 oz (59.8 kg)   SpO2 98%   BMI 23.35 kg/m    Wt Readings from Last 3 Encounters:  08/19/21 131 lb 12.8 oz (59.8 kg)  02/07/21 129 lb 12.8 oz (58.9 kg)  08/22/19 128 lb 12.8 oz (58.4 kg)    GENERAL:  No apparent distress, Aox3, looks younger than stated age HEENT:  No carotid bruits, +2 carotid impulses, no scleral icterus CAR: Irregular RR with 2/6 SEM without gallops, rubs, or thrills RES:  Clear to auscultation bilaterally ABD:  Soft, nontender, nondistended, positive bowel sounds x 4 VASC:  +2 radial pulses, +2 carotid pulses, palpable pedal pulses NEURO:  CN 2-12 grossly intact; motor and sensory grossly intact PSYCH:  No active depression or  anxiety EXT:  No edema, ecchymosis, or cyanosis  Signed, Early Osmond, MD  08/19/2021 11:56 AM    [address], [place], [place]  [address] Phone: [phone]; Fax: [phone]   Note:  This document was prepared using Dragon voice recognition software and may include unintentional dictation errors.

## 2021-08-19 NOTE — Telephone Encounter (Signed)
Spoke with pt who is calling in this AM after having several episodes of CP this weekend with exertion.  He reports this has been occurring off/on over the last few months but has become more frequent.  He has chest pain with activities such as walking up 1 flight of stairs or mowing his lawn.  He reports it is an "8" on a scale of 1 to 10.  It causes him to stop what he is doing and sit down.  It lasts approximately 5 mins and resolves with rest.  He denies any current chest pain, no shortness of breath or radiation during the episodes.  Pt does not have SL Ntg and no noted CAD.  Does has a HX of AS, PACs and hypertensive heart disease.  Pt scheduled today with DOD at 11am for further evaluation.  He is aware to call 911 prior to the appt should cp reoccur.

## 2021-08-19 NOTE — Patient Instructions (Addendum)
Medication Instructions:  Your physician has recommended you make the following change in your medication: Start Isosorbide mononitrate 30 mg by mouth daily at bedtime. Start Metoprolol Succinate 12.5 mg by mouth daily at bedtime. A prescription for nitroglycerin has been sent to your pharmacy to use as needed  *If you need a refill on your cardiac medications before your next appointment, please call your pharmacy*   Lab Work: Lab work to be done today--BMP If you have labs (blood work) drawn today and your tests are completely normal, you will receive your results only by: MyChart Message (if you have MyChart) OR A paper copy in the mail If you have any lab test that is abnormal or we need to change your treatment, we will call you to review the results.   Testing/Procedures: Your physician has requested that you have an echocardiogram. Echocardiography is a painless test that uses sound waves to create images of your heart. It provides your doctor with information about the size and shape of your heart and how well your heart's chambers and valves are working. This procedure takes approximately one hour. There are no restrictions for this procedure.    Follow-Up: At [facility], you and your health needs are our priority.  As part of our continuing mission to provide you with exceptional heart care, we have created designated Provider Care Teams.  These Care Teams include your primary Cardiologist (physician) and Advanced Practice Providers (APPs -  Physician Assistants and Nurse Practitioners) who all work together to provide you with the care you need, when you need it.  We recommend signing up for the patient portal called "MyChart".  Sign up information is provided on this After Visit Summary.  MyChart is used to connect with patients for Virtual Visits (Telemedicine).  Patients are able to view lab/test results, encounter notes, upcoming appointments, etc.  Non-urgent messages can  be sent to your provider as well.   To learn more about what you can do with MyChart, go to [email or web address].    Your next appointment:   A couple of weeks (after echo)  The format for your next appointment:   In Person  Provider:   Lance Muss, MD or APP  If primary card or EP is not listed click here to update    :1}    Other Instructions     Nitroglycerin Sublingual Tablets What is this medication? NITROGLYCERIN (nye troe GLI ser in) prevents and treats chest pain (angina). It works by relaxing blood vessels, which decreases the amount of work the heart has to do. It belongs to a group of medications called nitrates. This medicine may be used for other purposes; ask your health care provider or pharmacist if you have questions. COMMON BRAND NAME(S): Nitroquick, Nitrostat, Nitrotab What should I tell my care team before I take this medication? They need to know if you have any of these conditions: Anemia Head injury, recent stroke, or bleeding in the brain Liver disease Previous heart attack An unusual or allergic reaction to nitroglycerin, other medications, foods, dyes, or preservatives Pregnant or trying to get pregnant Breast-feeding How should I use this medication? Take this medication by mouth as needed. Use at the first sign of an angina attack (chest pain or tightness). You can also take this medication 5 to 10 minutes before an event likely to produce chest pain. Follow the directions exactly as written on the prescription label. Place one tablet under your tongue and let it dissolve. Do not  swallow whole. Replace the dose if you accidentally swallow it. It will help if your mouth is not dry. Saliva around the tablet will help it to dissolve more quickly. Do not eat or drink, smoke or chew tobacco while a tablet is dissolving. Sit down when taking this medication. In an angina attack, you should feel better within 5 minutes after your first dose. You can  take a dose every 5 minutes up to a total of 3 doses. If you do not feel better or feel worse after 1 dose, call 9-1-1 at once. Do not take more than 3 doses in 15 minutes. Your care team might give you other directions. Follow those directions if they do. Do not take your medication more often than directed. Talk to your care team about the use of this medication in children. Special care may be needed. Overdosage: If you think you have taken too much of this medicine contact a poison control center or emergency room at once. NOTE: This medicine is only for you. Do not share this medicine with others. What if I miss a dose? This does not apply. This medication is only used as needed. What may interact with this medication? Do not take this medication with any of the following: Certain migraine medications like ergotamine and dihydroergotamine (DHE) Medications used to treat erectile dysfunction like sildenafil, tadalafil, and vardenafil Riociguat This medication may also interact with the following: Alteplase Aspirin Heparin Medications for high blood pressure Medications for mental depression Other medications used to treat angina Phenothiazines like chlorpromazine, mesoridazine, prochlorperazine, thioridazine This list may not describe all possible interactions. Give your health care provider a list of all the medicines, herbs, non-prescription drugs, or dietary supplements you use. Also tell them if you smoke, drink alcohol, or use illegal drugs. Some items may interact with your medicine. What should I watch for while using this medication? Tell your care team if you feel your medication is no longer working. Keep this medication with you at all times. Sit or lie down when you take your medication to prevent falling if you feel dizzy or faint after using it. Try to remain calm. This will help you to feel better faster. If you feel dizzy, take several deep breaths and lie down with your  feet propped up, or bend forward with your head resting between your knees. You may get drowsy or dizzy. Do not drive, use machinery, or do anything that needs mental alertness until you know how this medication affects you. Do not stand or sit up quickly, especially if you are an older patient. This reduces the risk of dizzy or fainting spells. Alcohol can make you more drowsy and dizzy. Avoid alcoholic drinks. Do not treat yourself for coughs, colds, or pain while you are taking this medication without asking your care team for advice. Some ingredients may increase your blood pressure. What side effects may I notice from receiving this medication? Side effects that you should report to your care team as soon as possible: Allergic reactions--skin rash, itching, hives, swelling of the face, lips, tongue, or throat Headache, unusual weakness or fatigue, shortness of breath, nausea, vomiting, rapid heartbeat, blue skin or lips, which may be signs of methemoglobinemia Increased pressure around the brain--severe headache, blurry vision, change in vision, nausea, vomiting Low blood pressure--dizziness, feeling faint or lightheaded, blurry vision Slow heartbeat--dizziness, feeling faint or lightheaded, confusion, trouble breathing, unusual weakness or fatigue Worsening chest pain (angina)--pain, pressure, or tightness in the chest, neck, back,  or arms Side effects that usually do not require medical attention (report to your care team if they continue or are bothersome): Dizziness Flushing Headache This list may not describe all possible side effects. Call your doctor for medical advice about side effects. You may report side effects to FDA at 1-800-FDA-1088. Where should I keep my medication? Keep out of the reach of children. Store at room temperature between 20 and 25 degrees C (68 and 77 degrees F). Store in Retail buyeroriginal container. Protect from light and moisture. Keep tightly closed. Throw away any  unused medication after the expiration date. NOTE: This sheet is a summary. It may not cover all possible information. If you have questions about this medicine, talk to your doctor, pharmacist, or health care provider.  2023 Elsevier/Gold Standard (2020-06-26 00:00:00)   Important Information About Sugar

## 2021-08-19 NOTE — Telephone Encounter (Signed)
Pt c/o of Chest Pain: STAT if CP now or developed within 24 hours  1. Are you having CP right now? Yes, slightly   2. Are you experiencing any other symptoms (ex. SOB, nausea, vomiting, sweating)? No   3. How long have you been experiencing CP? Has been occurring for about 3 months, got bad this weekend   4. Is your CP continuous or coming and going? Coming and going with exertion  5. Have you taken Nitroglycerin? No    ? Transferring to triage due to being STAT.

## 2021-08-20 ENCOUNTER — Telehealth: Payer: Self-pay | Admitting: *Deleted

## 2021-08-20 DIAGNOSIS — R072 Precordial pain: Secondary | ICD-10-CM

## 2021-08-20 MED ORDER — METOPROLOL TARTRATE 100 MG PO TABS: 100.0000 mg | ORAL_TABLET | ORAL | 0 refills | Status: DC

## 2021-08-20 NOTE — Telephone Encounter (Signed)
Orbie Pyo, MD  08/20/2021  8:28 AM EDT Back to Top    Cr is ok, let's set patient up for coronary CTA re: precordial pain per my DOD note yesterday, thanks.    HR 93  Crea 1.24 BUN 15  Pt aware of lab results and order for Coronary CTA.   Aware instructions will be sent to him through MyChart.  He will call back if any questions or concerns.

## 2021-09-02 DIAGNOSIS — I10 Essential (primary) hypertension: Secondary | ICD-10-CM | POA: Diagnosis not present

## 2021-09-02 DIAGNOSIS — E1122 Type 2 diabetes mellitus with diabetic chronic kidney disease: Secondary | ICD-10-CM | POA: Diagnosis not present

## 2021-09-02 DIAGNOSIS — E7800 Pure hypercholesterolemia, unspecified: Secondary | ICD-10-CM | POA: Diagnosis not present

## 2021-09-02 DIAGNOSIS — G479 Sleep disorder, unspecified: Secondary | ICD-10-CM | POA: Diagnosis not present

## 2021-09-02 DIAGNOSIS — R609 Edema, unspecified: Secondary | ICD-10-CM | POA: Diagnosis not present

## 2021-09-06 ENCOUNTER — Telehealth (HOSPITAL_COMMUNITY): Payer: Self-pay | Admitting: *Deleted

## 2021-09-06 NOTE — Telephone Encounter (Signed)
Attempted to call patient regarding upcoming cardiac CT appointment. °Left message on voicemail with name and callback number ° °Tocarra Gassen RN Navigator Cardiac Imaging ° [facility] °[phone] Office °[phone] Cell ° °

## 2021-09-06 NOTE — Telephone Encounter (Signed)
Patient returning call regarding upcoming cardiac imaging study; pt verbalizes understanding of appt date/time, parking situation and where to check in, pre-test NPO status and medications ordered, and verified current allergies; name and call back number provided for further questions should they arise  Phinley Schall RN Navigator Cardiac Imaging [facility] [phone] office [phone] cell  Patient to take 100mg metoprolol tartrate two hours prior to his cardiac CT scan.  He is aware to arrive at 8:30am. 

## 2021-09-10 ENCOUNTER — Other Ambulatory Visit: Payer: Self-pay | Admitting: Cardiology

## 2021-09-10 ENCOUNTER — Ambulatory Visit (HOSPITAL_COMMUNITY)
Admission: RE | Admit: 2021-09-10 | Discharge: 2021-09-10 | Disposition: A | Discharge status: Home / Self Care | Payer: Medicare Other | Source: Ambulatory Visit | Attending: Cardiology | Admitting: Cardiology

## 2021-09-10 ENCOUNTER — Ambulatory Visit (HOSPITAL_COMMUNITY)
Admission: RE | Admit: 2021-09-10 | Discharge: 2021-09-10 | Disposition: A | Discharge status: Home / Self Care | Payer: Medicare Other | Source: Ambulatory Visit | Attending: Internal Medicine | Admitting: Internal Medicine

## 2021-09-10 DIAGNOSIS — R072 Precordial pain: Secondary | ICD-10-CM | POA: Insufficient documentation

## 2021-09-10 DIAGNOSIS — R931 Abnormal findings on diagnostic imaging of heart and coronary circulation: Secondary | ICD-10-CM | POA: Insufficient documentation

## 2021-09-10 DIAGNOSIS — I2510 Atherosclerotic heart disease of native coronary artery without angina pectoris: Secondary | ICD-10-CM

## 2021-09-10 IMAGING — CT CT HEART MORP W/ CTA COR W/ SCORE W/ CA W/CM &/OR W/O CM
4 of 7 series · 8 of 20 positions shown, 9 images · non-contrast
Comparison: None available aside from a CT of the abdomen from 3779.

Addendum:
EXAM:
Cardiac/Coronary CTA
TECHNIQUE: A non-contrast, gated CT scan was obtained with axial slices of 3 mm
through the heart for calcium scoring. Calcium scoring was performed
using the Agatston method. A 120 kV prospective, gated, contrast
cardiac scan was obtained. Gantry rotation speed was 250 msecs and
collimation was 0.6 mm. Two sublingual nitroglycerin tablets (0.8
mg) were given. The 3D data set was reconstructed in 5% intervals of
the 35-75% of the R-R cycle. Diastolic phases were analyzed on a
dedicated workstation using MPR, MIP, and VRT modes. The patient
received 95 cc of contrast.

[Series 6: ts diast sharp · axial · 0.39mm/px · z∈[+1155,+1187]mm · 2 of 239 slices shown]
[im 80/239  lung]
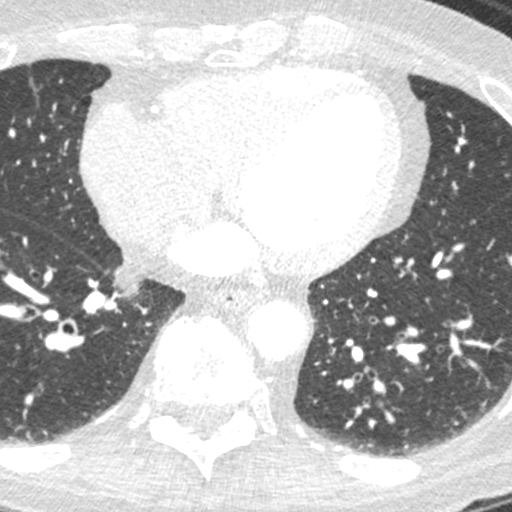
[im 159/239  lung]
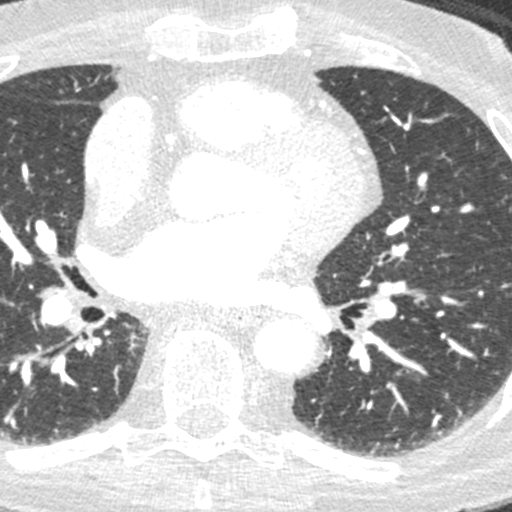

[Series 7: best diast · axial · 0.39mm/px · z∈[+1155,+1187]mm · 2 of 239 slices shown, 3 images]
[im 80/239  vessel]
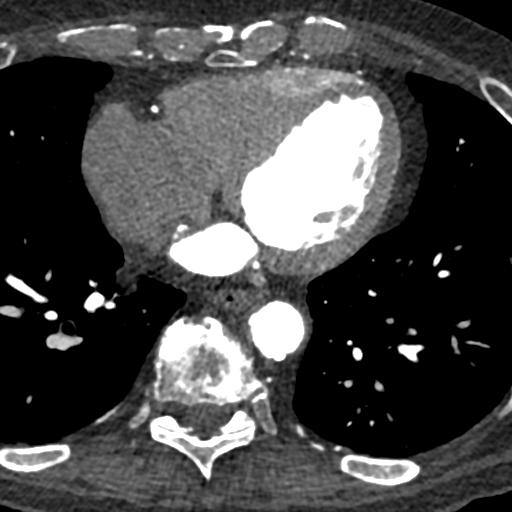
[im 80/239  lung]
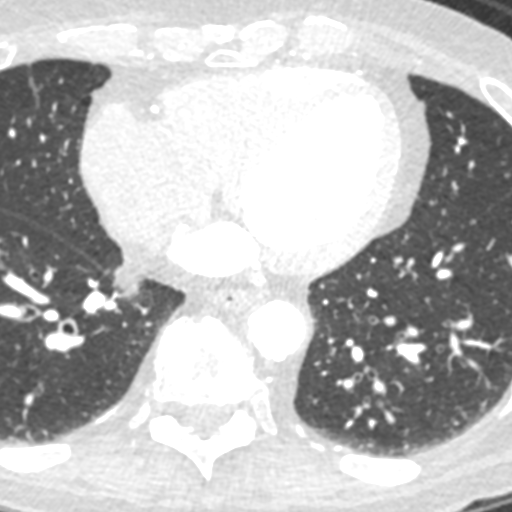
[im 159/239  vessel]
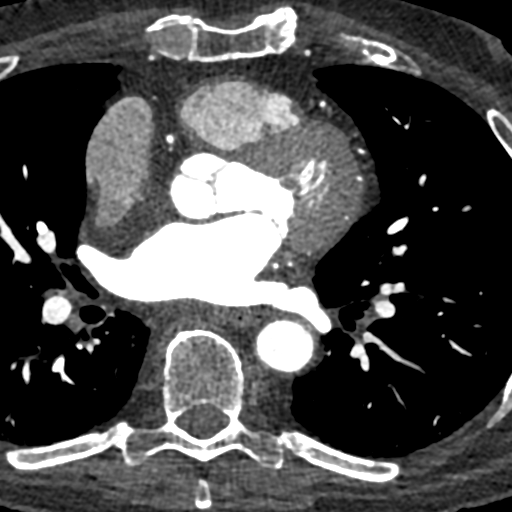

[Series 8: ts syst sharp · axial · 0.39mm/px · z∈[+1155,+1187]mm · 2 of 239 slices shown]
[im 80/239  lung]
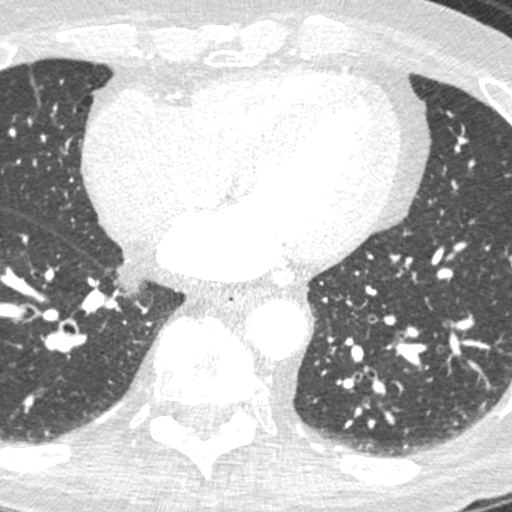
[im 159/239  lung]
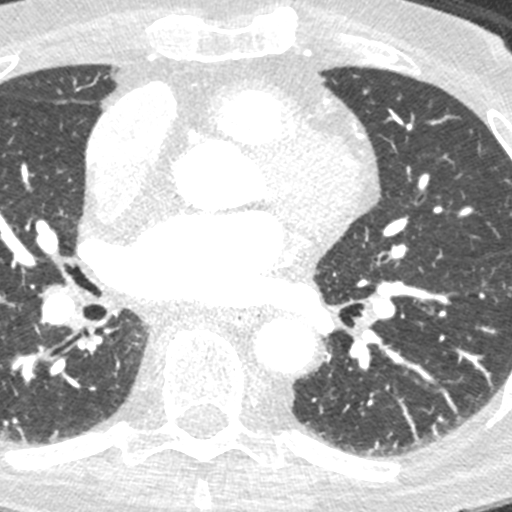

[Series 9: best syst · axial · 0.39mm/px · z∈[+1155,+1187]mm · 2 of 239 slices shown]
[im 80/239  vessel]
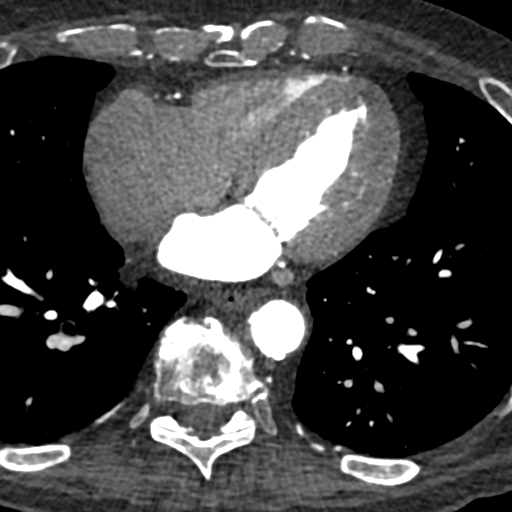
[im 159/239  vessel]
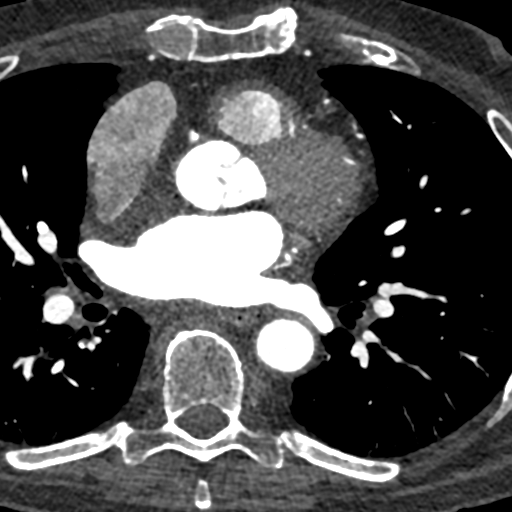

[8 of 20 positions shown; findings below may reference images not displayed]

FINDINGS: Image quality: Excellent.

Noise artifact is: Limited.

Coronary Arteries:  Normal coronary origin.  Right dominance.

Left main: The left main is a large caliber vessel with a normal
take off from the left coronary cusp that trifurcates into a LAD,
LCX, and ramus intermedius. There is heavy calcified plaque in the
distal LM with moderate stenosis of 50-69%.

Left anterior descending artery: The LAD gives off 2 patent diagonal
branches. The vessel is heavily calcified in the proximal and mid
portion of the artery. There is complex mixed plaque in the ostial,
proximal and mid LAD that is poorly defined. Stenosis is at least
50-60% but may be severe > 70%. The D1 is heavily calcified and
unable to estimate stenosis.

Ramus intermedius: Diffusely diseased in the proximal and mid
vessel.

Left circumflex artery: The LCX is non-dominant and gives off 2
patent obtuse marginal branches. There is moderate calcified plaque
in the proximal LCX with associated stenosis of 50-69%. This may be
overestimated in setting of significant blooming artifact. There is
mild calcified plaque in the distal LCx with associated stenosis of
25-49%.

Right coronary artery: The RCA is dominant with normal take off from
the right coronary cusp. The RCA terminates as a PDA and right
posterolateral branch. There is moderate to severe calcified plaque
in the ostial RCA with associated stenosis of 50-69% stenosis but
could be greater than 70%. There is significant blooming artifact.
There is moderate to severe mixed plaque in the proximal RCA with
associated stenosis of at least 50-69% and possibly >70%. There is
mild to moderate calcified plaque in the mid to distal RCA with
associated stenosis of at least 25-49%.

Right Atrium: Right atrial size is within normal limits.

Right Ventricle: The right ventricular cavity is within normal
limits.

Left Atrium: Left atrial size is normal in size with no left atrial
appendage filling defect.

Left Ventricle: The ventricular cavity size is within normal limits.
There are no stigmata of prior infarction. There is no abnormal
filling defect.

Pulmonary arteries: Normal in size without proximal filling defect.

Pulmonary veins: Normal pulmonary venous drainage.

Pericardium: Normal thickness with no significant effusion or
calcium present.

Cardiac valves: The aortic valve is trileaflet with mild
calcification. The mitral valve is normal structure with mitral
annular calcification.

Aorta: Normal caliber with atherosclerosis of the ascending and
descending aorta.

Extra-cardiac findings: See attached radiology report for
non-cardiac structures.
IMPRESSION: 1. Coronary calcium score of 7588. This was 83rd percentile for
age-, sex, and race-matched controls.

2.  Normal coronary origin with right dominance.

3. Moderate to severe atherosclerosis of the RCA and LAD. CAD RADS
3-4a.

4. Consider symptom-guided anti-ischemic and preventive
pharmacotherapy as well as risk factor modification per
guideline-directed care.

5.  This study has been submitted for FFR analysis.

RECOMMENDATIONS:
1. CAD-RADS 0: No evidence of CAD (0%). Consider non-atherosclerotic
causes of chest pain.

2. CAD-RADS 1: Minimal non-obstructive CAD (0-24%). Consider
non-atherosclerotic causes of chest pain. Consider preventive
therapy and risk factor modification.

3. CAD-RADS 2: Mild non-obstructive CAD (25-49%). Consider
non-atherosclerotic causes of chest pain. Consider preventive
therapy and risk factor modification.

4. CAD-RADS 3: Moderate stenosis. Consider symptom-guided
anti-ischemic pharmacotherapy as well as risk factor modification
per guideline directed care. Additional analysis with CT FFR will be
submitted.

5. CAD-RADS 4: Severe stenosis. (70-99% or > 50% left main). Cardiac
catheterization or CT FFR is recommended. Consider symptom-guided
anti-ischemic pharmacotherapy as well as risk factor modification
per guideline directed care. Invasive coronary angiography
recommended with revascularization per published guideline
statements.

6. CAD-RADS 5: Total coronary occlusion (100%). Consider cardiac
catheterization or viability assessment. Consider symptom-guided
anti-ischemic pharmacotherapy as well as risk factor modification
per guideline directed care.

7. CAD-RADS N: Non-diagnostic study. Obstructive CAD can't be
excluded. Alternative evaluation is recommended.

. The  interpretation by the cardiologist is attached.

ADDENDUM:
OVER-READ INTERPRETATION  CT CHEST

The following report is an over-read performed by radiologist Dr.
over-read does not include interpretation of cardiac or coronary
anatomy or pathology. The coronary calcium and coronary CT
angiography interpretation by the cardiologist is attached. Imaging
of the chest is focused on cardiac structures and excludes much of
the chest on CT.
FINDINGS: Cardiovascular: Please see dedicated report for cardiovascular
details. There is aortic atherosclerosis

Mediastinum/Nodes: No adenopathy or acute process in the visualized
portions of the mediastinum.

Lungs/Pleura: Visualized lungs are clear and airways are patent to
the extent evaluated.

Upper Abdomen: Very limited imaging of the upper abdomen without
acute process.

Musculoskeletal: No acute bone finding. No destructive bone process.
Spinal degenerative changes.
IMPRESSION: No acute or significant extracardiac findings.

Aortic Atherosclerosis ([id number]).

*** End of Addendum ***
EXAM:
Cardiac/Coronary CTA
FINDINGS: Image quality: Excellent.

Noise artifact is: Limited.

Coronary Arteries:  Normal coronary origin.  Right dominance.

Left main: The left main is a large caliber vessel with a normal
take off from the left coronary cusp that trifurcates into a LAD,
LCX, and ramus intermedius. There is heavy calcified plaque in the
distal LM with moderate stenosis of 50-69%.

Left anterior descending artery: The LAD gives off 2 patent diagonal
branches. The vessel is heavily calcified in the proximal and mid
portion of the artery. There is complex mixed plaque in the ostial,
proximal and mid LAD that is poorly defined. Stenosis is at least
50-60% but may be severe > 70%. The D1 is heavily calcified and
unable to estimate stenosis.

Ramus intermedius: Diffusely diseased in the proximal and mid
vessel.

Left circumflex artery: The LCX is non-dominant and gives off 2
patent obtuse marginal branches. There is moderate calcified plaque
in the proximal LCX with associated stenosis of 50-69%. This may be
overestimated in setting of significant blooming artifact. There is
mild calcified plaque in the distal LCx with associated stenosis of
25-49%.

Right coronary artery: The RCA is dominant with normal take off from
the right coronary cusp. The RCA terminates as a PDA and right
posterolateral branch. There is moderate to severe calcified plaque
in the ostial RCA with associated stenosis of 50-69% stenosis but
could be greater than 70%. There is significant blooming artifact.
There is moderate to severe mixed plaque in the proximal RCA with
associated stenosis of at least 50-69% and possibly >70%. There is
mild to moderate calcified plaque in the mid to distal RCA with
associated stenosis of at least 25-49%.

Right Atrium: Right atrial size is within normal limits.

Right Ventricle: The right ventricular cavity is within normal
limits.

Left Atrium: Left atrial size is normal in size with no left atrial
appendage filling defect.

Left Ventricle: The ventricular cavity size is within normal limits.
There are no stigmata of prior infarction. There is no abnormal
filling defect.

Pulmonary arteries: Normal in size without proximal filling defect.

Pulmonary veins: Normal pulmonary venous drainage.

Pericardium: Normal thickness with no significant effusion or
calcium present.

Cardiac valves: The aortic valve is trileaflet with mild
calcification. The mitral valve is normal structure with mitral
annular calcification.

Aorta: Normal caliber with atherosclerosis of the ascending and
descending aorta.

Extra-cardiac findings: See attached radiology report for
non-cardiac structures.
IMPRESSION: 1. Coronary calcium score of 7588. This was 83rd percentile for
age-, sex, and race-matched controls.

2.  Normal coronary origin with right dominance.

3. Moderate to severe atherosclerosis of the RCA and LAD. CAD RADS
3-4a.

4. Consider symptom-guided anti-ischemic and preventive
pharmacotherapy as well as risk factor modification per
guideline-directed care.

5.  This study has been submitted for FFR analysis.

RECOMMENDATIONS:
1. CAD-RADS 0: No evidence of CAD (0%). Consider non-atherosclerotic
causes of chest pain.

2. CAD-RADS 1: Minimal non-obstructive CAD (0-24%). Consider
non-atherosclerotic causes of chest pain. Consider preventive
therapy and risk factor modification.

3. CAD-RADS 2: Mild non-obstructive CAD (25-49%). Consider
non-atherosclerotic causes of chest pain. Consider preventive
therapy and risk factor modification.

4. CAD-RADS 3: Moderate stenosis. Consider symptom-guided
anti-ischemic pharmacotherapy as well as risk factor modification
per guideline directed care. Additional analysis with CT FFR will be
submitted.

5. CAD-RADS 4: Severe stenosis. (70-99% or > 50% left main). Cardiac
catheterization or CT FFR is recommended. Consider symptom-guided
anti-ischemic pharmacotherapy as well as risk factor modification
per guideline directed care. Invasive coronary angiography
recommended with revascularization per published guideline
statements.

6. CAD-RADS 5: Total coronary occlusion (100%). Consider cardiac
catheterization or viability assessment. Consider symptom-guided
anti-ischemic pharmacotherapy as well as risk factor modification
per guideline directed care.

7. CAD-RADS N: Non-diagnostic study. Obstructive CAD can't be
excluded. Alternative evaluation is recommended.

. The  interpretation by the cardiologist is attached.

## 2021-09-10 MED ORDER — IOHEXOL 350 MG/ML SOLN
100.0000 mL | Freq: Once | INTRAVENOUS | Status: AC | PRN
  Administered 2021-09-10: 100 mL via INTRAVENOUS

## 2021-09-10 MED ORDER — NITROGLYCERIN 0.4 MG SL SUBL
0.8000 mg | SUBLINGUAL_TABLET | Freq: Once | SUBLINGUAL | Status: AC
  Administered 2021-09-10: 0.8 mg via SUBLINGUAL

## 2021-09-10 MED ORDER — NITROGLYCERIN 0.4 MG SL SUBL
SUBLINGUAL_TABLET | SUBLINGUAL | Status: AC
  Filled 2021-09-10: qty 2

## 2021-09-11 ENCOUNTER — Ambulatory Visit (HOSPITAL_COMMUNITY): Payer: Medicare Other | Attending: Internal Medicine

## 2021-09-11 DIAGNOSIS — R072 Precordial pain: Secondary | ICD-10-CM

## 2021-09-11 DIAGNOSIS — I359 Nonrheumatic aortic valve disorder, unspecified: Secondary | ICD-10-CM | POA: Insufficient documentation

## 2021-09-11 DIAGNOSIS — I350 Nonrheumatic aortic (valve) stenosis: Secondary | ICD-10-CM

## 2021-09-11 LAB — ECHOCARDIOGRAM COMPLETE
AR max vel: 1.03 cm2
AV Area VTI: 1.07 cm2
AV Area mean vel: 1.03 cm2
AV Mean grad: 18 mmHg
AV Peak grad: 32.7 mmHg
Ao pk vel: 2.86 m/s
Area-P 1/2: 3.48 cm2
S' Lateral: 2.4 cm

## 2021-09-22 NOTE — H&P (View-Only) (Signed)
Cardiology Office Note:    Date:  09/23/2021   ID:  Chad York, DOB 1927/08/06, MRN [id number]  PCP:  Lawerance Cruel, MD   [facility] Providers Cardiologist:  Larae Grooms, MD     Referring MD: Lawerance Cruel, MD   Chief Complaint: chest pain, abnormal coronary CTA  History of Present Illness:    Chad York is a very pleasant 88 y.o. male with a hx of type 2 diabetes, hypertension, hyperlipidemia, stage III CKD, aortic stenosis, and PACs.   Originally seen by Dr. Mare Ferrari, he has maintained consistent follow-up aortic valve stenosis. Echocardiogram 2014 showed peak gradient of 24 and mean gradient of 13.  He has had essentially no change over the years.  Seen by Dr. Irish Lack on 02/07/2021 at which time he continued to exercise on a consistent basis and had no symptoms of worsening valve function.  He was advised to follow-up in 1 year.  He called our office 08/19/2021 with concerns for chest pain occurring for about 3 months which had worsened over the previous weekend.  It was coming and going with exertion and caused him to have to sit down and rest in order to resolve.  Pain rated 8/10 would resolve after approximately 5 minutes. He was scheduled to see Dr. Ali Lowe that day as a work-in DOD visit for chest pain. He was started on Toprol XL 12.5 mg and Imdur 30 mg at bedtime in addition to amlodipine for antianginal agents.  Echocardiogram revealed normal LVEF 60-65%, no rwma, G1DD, moderate aortic stenosis. Coronary CTA revealed calcium score of 1988, 83rd percentile, probable flow-limiting lesion in distal left main, diffuse flow limitation throughout the LAD and LCx, also probable flow-limiting lesion in proximal RCA.   Today, he is here with his wife for follow-up.  We discussed the findings from recent coronary CTA.  He states no further recurrence of chest pain since starting Imdur and metoprolol with the exception of discomfort on 2 occasions when lying flat  in bed.  He continues to exercise without chest pain or dyspnea. He denies lower extremity edema, fatigue, palpitations, melena, hematuria, hemoptysis, diaphoresis, weakness, presyncope, syncope, orthopnea, and PND.   Past Medical History:  Diagnosis Date   Aortic stenosis    Diabetes mellitus    Dizzy spells    with near syncope at times   H/O small bowel obstruction    RESOLVED W/O SURGERY   Hyperlipidemia     Past Surgical History:  Procedure Laterality Date   APPENDECTOMY  [id number]   COMPLICATED BY ABSCESS FORMATION   CATARACT EXTRACTION  2004   LEFT EYE   COLONOSCOPY  2004    Current Medications: Current Meds  Medication Sig   amLODipine (NORVASC) 5 MG tablet Take 5 mg by mouth daily.   Ascorbic Acid (VITAMIN C) 500 MG CAPS Take 1 capsule by mouth daily.   aspirin EC 81 MG tablet Take 1 tablet (81 mg total) by mouth daily. Swallow whole.   atorvastatin (LIPITOR) 20 MG tablet Take 20 mg by mouth Daily.   bacitracin 500 UNIT/GM ointment Apply 1 application  topically as directed.   isosorbide mononitrate (IMDUR) 30 MG 24 hr tablet Take 1 tablet (30 mg total) by mouth at bedtime.   metoprolol succinate (TOPROL XL) 25 MG 24 hr tablet Take 0.5 tablets (12.5 mg total) by mouth at bedtime.   Multiple Vitamin (MULTIVITAMIN) tablet Take 1 tablet by mouth daily.   nitroGLYCERIN (NITROSTAT) 0.4 MG SL tablet Place 1  tablet (0.4 mg total) under the tongue every 5 (five) minutes as needed for chest pain.   ramipril (ALTACE) 10 MG capsule Take 10 mg by mouth Twice daily.   vitamin E 45 MG (100 UNITS) capsule Take 1 capsule by mouth daily.   zolpidem (AMBIEN) 10 MG tablet Take 10 mg by mouth. Take 1/2 tablet by mouth five times a week     Allergies:   Metronidazole, Ciprofloxacin, and Sulfamethoxazole-trimethoprim   Social History   Socioeconomic History   Marital status: Married    Spouse name: Not on file   Number of children: 2   Years of education: Not on file   Highest education  level: Not on file  Occupational History   Occupation: RETIRED    Comment: [employer]  Tobacco Use   Smoking status: Former    Types: Cigarettes    Quit date: 03/31/1985    Years since quitting: 36.5   Smokeless tobacco: Never  Vaping Use   Vaping Use: Never used  Substance and Sexual Activity   Alcohol use: Yes    Comment: 4-6 DRINKS WEEKLY    Drug use: No   Sexual activity: Not on file  Other Topics Concern   Not on file  Social History Narrative   Not on file   Social Determinants of Health   Financial Resource Strain: Not on file  Food Insecurity: Not on file  Transportation Needs: Not on file  Physical Activity: Not on file  Stress: Not on file  Social Connections: Not on file     Family History: The patient's family history includes Heart disease in his brother, brother, and father; Heart failure in his father; Stroke in his brother.  ROS:   Please see the history of present illness.  All other systems reviewed and are negative.  Labs/Other Studies Reviewed:    The following studies were reviewed today:  Echo 09/11/21  1. Left ventricular ejection fraction, by estimation, is 60 to 65%. The  left ventricle has normal function. The left ventricle has no regional  wall motion abnormalities. Left ventricular diastolic parameters are  consistent with Grade I diastolic  dysfunction (impaired relaxation).   2. Right ventricular systolic function is normal. The right ventricular  size is normal.   3. The mitral valve is abnormal. Trivial mitral valve regurgitation.   4. The aortic valve is tricuspid. There is moderate calcification of the  aortic valve. Aortic valve regurgitation is not visualized. Moderate  aortic valve stenosis. Aortic valve area, by VTI measures 1.07 cm. Aortic  valve mean gradient measures 18.0  mmHg. Aortic valve Vmax measures 2.86 m/s.   Comparison(s): Changes from prior study are noted. 02/17/2019: LVEF   55-60%.    CCTA 09/10/21  1. Coronary calcium score of 1988. This was 83rd percentile for age-, sex, and race-matched controls.   2.  Normal coronary origin with right dominance.   3. Moderate to severe atherosclerosis of the RCA and LAD. CAD RADS 3-4a.   4. Consider symptom-guided anti-ischemic and preventive pharmacotherapy as well as risk factor modification per guideline-directed care.   5.  This study has been submitted for FFR analysis.    1. Left Main: Probable hemodynamically significant lesion in distal LM: Proximal LM FFR = 0.99, Distal LM FFR = 0.57.   2. LAD: Diffusely reduced blood flow: Proximal FFR = 0.52 and Distal FFR = <0.5.   3. LCX: Diffusely reduced blood flow: Proximal FFR = 0.56, Mid FFR =  0.57, Distal FFR = 0.51.   4. RCA: Probable hemodynamically significant lesion: Proximal FFR = 0.85, Mid FFR = 0.63, Distal FFR = 0.56.   IMPRESSION: 1. Coronary CTA FFR flow analysis demonstrates probable flow limiting lesion in the distal LM (FFR 0.99>>0.57). There is diffuse flow limitation throughout the LAD and LCx likely related to LM lesion. There is also a probable flow limiting lesion in the proximal RCA (FFR 0.85>>0.56).   2.  Recommend cardiac catheterization  Recent Labs: 08/19/2021: BUN 15; Creatinine, Ser 1.24; Potassium 4.3; Sodium 141  Recent Lipid Panel No results found for: "CHOL", "TRIG", "HDL", "CHOLHDL", "VLDL", "LDLCALC", "LDLDIRECT"   Risk Assessment/Calculations:       Physical Exam:    VS:  BP (!) 140/56   Pulse 74   Ht 5\' 2"  (1.575 m)   Wt 127 lb (57.6 kg)   SpO2 98%   BMI 23.23 kg/m     Wt Readings from Last 3 Encounters:  09/23/21 127 lb (57.6 kg)  08/19/21 131 lb 12.8 oz (59.8 kg)  02/07/21 129 lb 12.8 oz (58.9 kg)     GEN: Well nourished, well developed in no acute distress HEENT: Normal NECK: No JVD; No carotid bruits CARDIAC: Irregular RR, harsh crescendo/decrescendo holosystolic murmur heard throughout  chest and upper back. No rubs, gallops RESPIRATORY:  Clear to auscultation without rales, wheezing or rhonchi  ABDOMEN: Soft, non-tender, non-distended MUSCULOSKELETAL:  No edema; No deformity. 2+ pedal pulses, equal bilaterally SKIN: Warm and dry NEUROLOGIC:  Alert and oriented x 3 PSYCHIATRIC:  Normal affect   EKG:  EKG is ordered today.  The ekg ordered today demonstrates sinus rhythm at 71 bpm with occasional PACs/PVCs, no acute change from previous tracing  Diagnoses:    1. Abnormal findings diagnostic imaging of heart and coronary circulation   2. Coronary artery disease of native artery of native heart with stable angina pectoris (HCC)   3. Aortic valve disease    Assessment and Plan:     CAD with stable angina: Coronary calcium score 1988, moderate to severe atherosclerosis of RCA and LAD, FFR revealed probable flow-limiting lesion in distal LM, diffuse flow limitation throughout the LAD and LCx likely related to left main lesion, also probable flow-limiting lesion in proximal RCA. Cardiac catheterization recommended by reading MD. No further episodes of chest pain since starting Imdur and metoprolol.  Reviewed findings with Dr. 13/10/22, DOD, who agrees that patient should proceed to catheterization. I have reviewed the risks of cardiac catheterization and he agrees to proceed.  We will get precath labs today.  Continue amlodipine, Imdur, metoprolol, atorvastatin.  Starting aspirin 81 mg daily.  Moderate aortic valve stenosis: Moderate calcification of the aortic valve with moderate aortic valve stenosis, mean gradient 18.0 mmHg, peak 32.7 mmHg by 2Decho 09/11/21.  He denies dyspnea, presyncope, or syncope. Chest pain on 2 occasions when lying flat. We will get right heart catheterization in addition to [facility] for evaluation of valve for consideration of potential TAVR in the future.  Hyperlipidemia LDL goal < 70: Significantly elevated calcium score on CTA.  LDL 52 on 09/02/21.  Continue  atorvastatin.  CKD: Creatinine 1.24, GFR 55 on 08/19/2021.  We will recheck basic metabolic panel today in the setting of upcoming catheterization.  Consider gentle hydration with coronary catheterization/angiography if creatinine is elevated.  Hypertension: BP a little elevated today.  Has otherwise been stable.  No medication changes today.  Shared Decision Making/Informed Consent The risks [stroke (1 in 1000), death (1 in  1000), kidney failure [usually temporary] (1 in 500), bleeding (1 in 200), allergic reaction [possibly serious] (1 in 200)], benefits (diagnostic support and management of coronary artery disease) and alternatives of a cardiac catheterization were discussed in detail with Mr. Quattrone and he is willing to proceed.    Medication Adjustments/Labs and Tests Ordered: Current medicines are reviewed at length with the patient today.  Concerns regarding medicines are outlined above.  Orders Placed This Encounter  Procedures   Basic Metabolic Panel (BMET)   CBC   EKG 12-Lead   Meds ordered this encounter  Medications   aspirin EC 81 MG tablet    Sig: Take 1 tablet (81 mg total) by mouth daily. Swallow whole.    Dispense:  90 tablet    Refill:  3    Patient Instructions  Medication Instructions:   START Asprin one (1) tablet by mouth (81 mg) daily.   *If you need a refill on your cardiac medications before your next appointment, please call your pharmacy*   Lab Work:  TODAY!!!!! BMET/CBC  If you have labs (blood work) drawn today and your tests are completely normal, you will receive your results only by: [facility] (if you have MyChart) OR A paper copy in the mail If you have any lab test that is abnormal or we need to change your treatment, we will call you to review the results.   Testing/Procedures:   [facility] OFFICE [place], [place] [address] Dept:  [phone] Loc: [phone]  LARRIE PERIMAN  09/23/2021  You are scheduled for a Cardiac Catheterization on Wednesday, June 28 with Dr. Lenna Sciara.  1. Please arrive at the Main Entrance A at [facility]: [place], [place] [address] at 11:00 AM (This time is two hours before your procedure to ensure your preparation). Free valet parking service is available.   Special note: Every effort is made to have your procedure done on time. Please understand that emergencies sometimes delay scheduled procedures.  2. Diet: Do not eat solid foods after midnight.  You may have clear liquids until 5 AM upon the day of the procedure.  3. Labs: You will need to have blood drawn on Monday, June 26 at [facility] at [facility]. [address]  Open: 7:30am - 5pm    Phone: [phone]. You do not need to be fasting.  4. Medication instructions in preparation for your procedure:   Contrast Allergy: No  On the morning of your procedure, take Aspirin and any morning medicines NOT listed above.  You may use sips of water.  5. Plan to go home the same day, you will only stay overnight if medically necessary. 6. You MUST have a responsible adult to drive you home. 7. An adult MUST be with you the first 24 hours after you arrive home. 8. Bring a current list of your medications, and the last time and date medication taken. 9. Bring ID and current insurance cards. 10.Please wear clothes that are easy to get on and off and wear slip-on shoes.  Thank you for allowing [place] to care for you!   -- [facility]    Follow-Up: At [facility], you and your health needs are our priority.  As part of our continuing mission to provide you with exceptional heart care, we have created designated Provider Care Teams.  These  Care Teams include your primary Cardiologist (physician) and Advanced Practice Providers (APPs -  Physician  Assistants and Nurse Practitioners) who all work together to provide you with the care you need, when you need it.  We recommend signing up for the patient portal called "MyChart".  Sign up information is provided on this After Visit Summary.  MyChart is used to connect with patients for Virtual Visits (Telemedicine).  Patients are able to view lab/test results, encounter notes, upcoming appointments, etc.  Non-urgent messages can be sent to your provider as well.   To learn more about what you can do with MyChart, go to [email or web address].    Your next appointment:   2 week(s)  The format for your next appointment:   In Person  Provider:   Dr. Lenor Coffin  If primary card or EP is not listed click here to update    :1}    Important Information About Sugar         Signed, Levi Aland, NP  09/23/2021 12:20 PM    [facility]

## 2021-09-23 ENCOUNTER — Ambulatory Visit (INDEPENDENT_AMBULATORY_CARE_PROVIDER_SITE_OTHER): Payer: Medicare Other | Admitting: Nurse Practitioner

## 2021-09-23 ENCOUNTER — Encounter: Payer: Self-pay | Admitting: Nurse Practitioner

## 2021-09-23 ENCOUNTER — Telehealth: Payer: Self-pay | Admitting: Nurse Practitioner

## 2021-09-23 VITALS — BP 140/56 | HR 74 | Ht 62.0 in | Wt 127.0 lb

## 2021-09-23 DIAGNOSIS — R072 Precordial pain: Secondary | ICD-10-CM | POA: Diagnosis not present

## 2021-09-23 DIAGNOSIS — R931 Abnormal findings on diagnostic imaging of heart and coronary circulation: Secondary | ICD-10-CM

## 2021-09-23 DIAGNOSIS — I25118 Atherosclerotic heart disease of native coronary artery with other forms of angina pectoris: Secondary | ICD-10-CM | POA: Diagnosis not present

## 2021-09-23 DIAGNOSIS — I359 Nonrheumatic aortic valve disorder, unspecified: Secondary | ICD-10-CM

## 2021-09-23 DIAGNOSIS — E785 Hyperlipidemia, unspecified: Secondary | ICD-10-CM | POA: Diagnosis not present

## 2021-09-23 LAB — CBC
Hematocrit: 39.1 % (ref 37.5–51.0)
Hemoglobin: 13.5 g/dL (ref 13.0–17.7)
MCH: 32.1 pg (ref 26.6–33.0)
MCHC: 34.5 g/dL (ref 31.5–35.7)
MCV: 93 fL (ref 79–97)
Platelets: 209 10*3/uL (ref 150–450)
RBC: 4.21 x10E6/uL (ref 4.14–5.80)
RDW: 13.4 % (ref 11.6–15.4)
WBC: 5.7 10*3/uL (ref 3.4–10.8)

## 2021-09-23 LAB — BASIC METABOLIC PANEL
BUN/Creatinine Ratio: 18 (ref 10–24)
BUN: 21 mg/dL (ref 10–36)
CO2: 26 mmol/L (ref 20–29)
Calcium: 9.5 mg/dL (ref 8.6–10.2)
Chloride: 104 mmol/L (ref 96–106)
Creatinine, Ser: 1.18 mg/dL (ref 0.76–1.27)
Glucose: 103 mg/dL — ABNORMAL HIGH (ref 70–99)
Potassium: 4.2 mmol/L (ref 3.5–5.2)
Sodium: 139 mmol/L (ref 134–144)
eGFR: 58 mL/min/{1.73_m2} — ABNORMAL LOW (ref >59)

## 2021-09-23 MED ORDER — ASPIRIN 81 MG PO TBEC: 81.0000 mg | DELAYED_RELEASE_TABLET | Freq: Every day | ORAL | 3 refills | Status: DC

## 2021-09-24 ENCOUNTER — Telehealth: Payer: Self-pay | Admitting: *Deleted

## 2021-09-24 NOTE — Progress Notes (Signed)
Spoke with patient-he knows to hold ramipril AM of cath tomorrow 09/25/21.

## 2021-09-25 ENCOUNTER — Other Ambulatory Visit: Payer: Self-pay

## 2021-09-25 ENCOUNTER — Ambulatory Visit (HOSPITAL_COMMUNITY)
Admission: RE | Admit: 2021-09-25 | Discharge: 2021-09-25 | Disposition: A | Discharge status: Home / Self Care | Payer: Medicare Other | Attending: Internal Medicine | Admitting: Internal Medicine

## 2021-09-25 ENCOUNTER — Encounter (HOSPITAL_COMMUNITY)
Admission: RE | Disposition: A | Discharge status: Home / Self Care | Payer: Self-pay | Source: Home / Self Care | Attending: Internal Medicine

## 2021-09-25 DIAGNOSIS — I129 Hypertensive chronic kidney disease with stage 1 through stage 4 chronic kidney disease, or unspecified chronic kidney disease: Secondary | ICD-10-CM | POA: Insufficient documentation

## 2021-09-25 DIAGNOSIS — I25118 Atherosclerotic heart disease of native coronary artery with other forms of angina pectoris: Secondary | ICD-10-CM | POA: Diagnosis not present

## 2021-09-25 DIAGNOSIS — N183 Chronic kidney disease, stage 3 unspecified: Secondary | ICD-10-CM | POA: Insufficient documentation

## 2021-09-25 DIAGNOSIS — R931 Abnormal findings on diagnostic imaging of heart and coronary circulation: Secondary | ICD-10-CM

## 2021-09-25 DIAGNOSIS — I2584 Coronary atherosclerosis due to calcified coronary lesion: Secondary | ICD-10-CM | POA: Diagnosis not present

## 2021-09-25 DIAGNOSIS — I25119 Atherosclerotic heart disease of native coronary artery with unspecified angina pectoris: Secondary | ICD-10-CM

## 2021-09-25 DIAGNOSIS — I350 Nonrheumatic aortic (valve) stenosis: Secondary | ICD-10-CM | POA: Insufficient documentation

## 2021-09-25 DIAGNOSIS — Z87891 Personal history of nicotine dependence: Secondary | ICD-10-CM | POA: Diagnosis not present

## 2021-09-25 DIAGNOSIS — E1122 Type 2 diabetes mellitus with diabetic chronic kidney disease: Secondary | ICD-10-CM | POA: Diagnosis not present

## 2021-09-25 DIAGNOSIS — I359 Nonrheumatic aortic valve disorder, unspecified: Secondary | ICD-10-CM

## 2021-09-25 DIAGNOSIS — Z79899 Other long term (current) drug therapy: Secondary | ICD-10-CM | POA: Insufficient documentation

## 2021-09-25 DIAGNOSIS — E785 Hyperlipidemia, unspecified: Secondary | ICD-10-CM | POA: Diagnosis not present

## 2021-09-25 HISTORY — PX: RIGHT/LEFT HEART CATH AND CORONARY ANGIOGRAPHY: CATH118266

## 2021-09-25 LAB — POCT I-STAT EG7
Acid-base deficit: 1 mmol/L (ref 0.0–2.0)
Acid-base deficit: 2 mmol/L (ref 0.0–2.0)
Bicarbonate: 23.1 mmol/L (ref 20.0–28.0)
Bicarbonate: 23.8 mmol/L (ref 20.0–28.0)
Calcium, Ion: 1.19 mmol/L (ref 1.15–1.40)
Calcium, Ion: 1.21 mmol/L (ref 1.15–1.40)
HCT: 35 % — ABNORMAL LOW (ref 39.0–52.0)
HCT: 35 % — ABNORMAL LOW (ref 39.0–52.0)
Hemoglobin: 11.9 g/dL — ABNORMAL LOW (ref 13.0–17.0)
Hemoglobin: 11.9 g/dL — ABNORMAL LOW (ref 13.0–17.0)
O2 Saturation: 77 %
O2 Saturation: 79 %
Potassium: 3.5 mmol/L (ref 3.5–5.1)
Potassium: 3.5 mmol/L (ref 3.5–5.1)
Sodium: 143 mmol/L (ref 135–145)
Sodium: 143 mmol/L (ref 135–145)
TCO2: 24 mmol/L (ref 22–32)
TCO2: 25 mmol/L (ref 22–32)
pCO2, Ven: 39.1 mmHg — ABNORMAL LOW (ref 44–60)
pCO2, Ven: 40.2 mmHg — ABNORMAL LOW (ref 44–60)
pH, Ven: 7.378 (ref 7.25–7.43)
pH, Ven: 7.38 (ref 7.25–7.43)
pO2, Ven: 42 mmHg (ref 32–45)
pO2, Ven: 44 mmHg (ref 32–45)

## 2021-09-25 LAB — POCT I-STAT 7, (LYTES, BLD GAS, ICA,H+H)
Acid-base deficit: 2 mmol/L (ref 0.0–2.0)
Bicarbonate: 22.5 mmol/L (ref 20.0–28.0)
Calcium, Ion: 1.17 mmol/L (ref 1.15–1.40)
HCT: 35 % — ABNORMAL LOW (ref 39.0–52.0)
Hemoglobin: 11.9 g/dL — ABNORMAL LOW (ref 13.0–17.0)
O2 Saturation: 100 %
Potassium: 3.4 mmol/L — ABNORMAL LOW (ref 3.5–5.1)
Sodium: 144 mmol/L (ref 135–145)
TCO2: 24 mmol/L (ref 22–32)
pCO2 arterial: 36.4 mmHg (ref 32–48)
pH, Arterial: 7.399 (ref 7.35–7.45)
pO2, Arterial: 171 mmHg — ABNORMAL HIGH (ref 83–108)

## 2021-09-25 SURGERY — RIGHT/LEFT HEART CATH AND CORONARY ANGIOGRAPHY
Anesthesia: LOCAL

## 2021-09-25 MED ORDER — SODIUM CHLORIDE 0.9 % IV SOLN: INTRAVENOUS | Status: AC

## 2021-09-25 MED ORDER — HYDRALAZINE HCL 20 MG/ML IJ SOLN: 10.0000 mg | INTRAMUSCULAR | Status: DC | PRN

## 2021-09-25 MED ORDER — ACETAMINOPHEN 325 MG PO TABS: 650.0000 mg | ORAL_TABLET | ORAL | Status: DC | PRN

## 2021-09-25 MED ORDER — SODIUM CHLORIDE 0.9% FLUSH: 3.0000 mL | Freq: Two times a day (BID) | INTRAVENOUS | Status: DC

## 2021-09-25 MED ORDER — HEPARIN (PORCINE) IN NACL 1000-0.9 UT/500ML-% IV SOLN
INTRAVENOUS | Status: DC | PRN
  Administered 2021-09-25 (×2): 500 mL

## 2021-09-25 MED ORDER — HEPARIN SODIUM (PORCINE) 1000 UNIT/ML IJ SOLN
INTRAMUSCULAR | Status: DC | PRN
  Administered 2021-09-25: 5000 [IU] via INTRAVENOUS
  Administered 2021-09-25: 3000 [IU] via INTRAVENOUS

## 2021-09-25 MED ORDER — HEPARIN (PORCINE) IN NACL 1000-0.9 UT/500ML-% IV SOLN
INTRAVENOUS | Status: AC
  Filled 2021-09-25: qty 500

## 2021-09-25 MED ORDER — MIDAZOLAM HCL 2 MG/2ML IJ SOLN
INTRAMUSCULAR | Status: AC
  Filled 2021-09-25: qty 2

## 2021-09-25 MED ORDER — FENTANYL CITRATE (PF) 100 MCG/2ML IJ SOLN
INTRAMUSCULAR | Status: AC
  Filled 2021-09-25: qty 2

## 2021-09-25 MED ORDER — LIDOCAINE HCL (PF) 1 % IJ SOLN
INTRAMUSCULAR | Status: AC
  Filled 2021-09-25: qty 30

## 2021-09-25 MED ORDER — MIDAZOLAM HCL 2 MG/2ML IJ SOLN
INTRAMUSCULAR | Status: DC | PRN
  Administered 2021-09-25 (×2): 1 mg via INTRAVENOUS

## 2021-09-25 MED ORDER — VERAPAMIL HCL 2.5 MG/ML IV SOLN
INTRAVENOUS | Status: AC
  Filled 2021-09-25: qty 2

## 2021-09-25 MED ORDER — ONDANSETRON HCL 4 MG/2ML IJ SOLN: 4.0000 mg | Freq: Four times a day (QID) | INTRAMUSCULAR | Status: DC | PRN

## 2021-09-25 MED ORDER — ASPIRIN 81 MG PO CHEW: 81.0000 mg | CHEWABLE_TABLET | ORAL | Status: DC

## 2021-09-25 MED ORDER — VERAPAMIL HCL 2.5 MG/ML IV SOLN
INTRAVENOUS | Status: DC | PRN
  Administered 2021-09-25: 10 mL via INTRA_ARTERIAL

## 2021-09-25 MED ORDER — SODIUM CHLORIDE 0.9 % WEIGHT BASED INFUSION: 1.0000 mL/kg/h | INTRAVENOUS | Status: DC

## 2021-09-25 MED ORDER — FENTANYL CITRATE (PF) 100 MCG/2ML IJ SOLN
INTRAMUSCULAR | Status: DC | PRN
  Administered 2021-09-25 (×2): 25 ug via INTRAVENOUS

## 2021-09-25 MED ORDER — SODIUM CHLORIDE 0.9% FLUSH: 3.0000 mL | INTRAVENOUS | Status: DC | PRN

## 2021-09-25 MED ORDER — LIDOCAINE HCL (PF) 1 % IJ SOLN
INTRAMUSCULAR | Status: DC | PRN
  Administered 2021-09-25 (×2): 2 mL

## 2021-09-25 MED ORDER — SODIUM CHLORIDE 0.9 % WEIGHT BASED INFUSION
3.0000 mL/kg/h | INTRAVENOUS | Status: AC
  Administered 2021-09-25: 3 mL/kg/h via INTRAVENOUS

## 2021-09-25 MED ORDER — LABETALOL HCL 5 MG/ML IV SOLN: 10.0000 mg | INTRAVENOUS | Status: DC | PRN

## 2021-09-25 MED ORDER — SODIUM CHLORIDE 0.9 % IV SOLN: 250.0000 mL | INTRAVENOUS | Status: DC | PRN

## 2021-09-25 MED ORDER — IOHEXOL 350 MG/ML SOLN
INTRAVENOUS | Status: DC | PRN
  Administered 2021-09-25: 80 mL

## 2021-09-25 MED ORDER — HEPARIN SODIUM (PORCINE) 1000 UNIT/ML IJ SOLN
INTRAMUSCULAR | Status: AC
  Filled 2021-09-25: qty 10

## 2021-09-25 MED ORDER — ASPIRIN 81 MG PO CHEW: 81.0000 mg | CHEWABLE_TABLET | Freq: Every day | ORAL | Status: DC

## 2021-09-25 SURGICAL SUPPLY — 22 items
BAND CMPR LRG ZPHR (HEMOSTASIS) ×1
BAND ZEPHYR COMPRESS 30 LONG (HEMOSTASIS) ×1 IMPLANT
CATH BALLN WEDGE 5F 110CM (CATHETERS) ×1 IMPLANT
CATH EXPO 5F MPA-1 (CATHETERS) ×1 IMPLANT
CATH INFINITI 6F ANG MULTIPACK (CATHETERS) ×1 IMPLANT
CATH INFINITI 6F FL3.5 (CATHETERS) ×1 IMPLANT
GLIDESHEATH SLEND SS 6F .021 (SHEATH) ×1 IMPLANT
GUIDEWIRE .025 260CM (WIRE) ×1 IMPLANT
GUIDEWIRE INQWIRE 1.5J.035X260 (WIRE) IMPLANT
INQWIRE 1.5J .035X260CM (WIRE) ×2
KIT ENCORE 26 ADVANTAGE (KITS) ×1 IMPLANT
KIT HEART LEFT (KITS) ×2 IMPLANT
PACK CARDIAC CATHETERIZATION (CUSTOM PROCEDURE TRAY) ×2 IMPLANT
SHEATH 6FR 75 DEST SLENDER (SHEATH) ×1 IMPLANT
SHEATH GLIDE SLENDER 4/5FR (SHEATH) ×1 IMPLANT
SHEATH PINNACLE 6F 10CM (SHEATH) ×1 IMPLANT
SHEATH PROBE COVER 6X72 (BAG) ×1 IMPLANT
TRANSDUCER W/STOPCOCK (MISCELLANEOUS) ×2 IMPLANT
TUBING CIL FLEX 10 FLL-RA (TUBING) ×2 IMPLANT
TUBING CONTRAST HIGH PRESS 48 (TUBING) ×1 IMPLANT
VALVE COPILOT STAT (MISCELLANEOUS) ×1 IMPLANT
WIRE ASAHI PROWATER 300CM (WIRE) ×1 IMPLANT

## 2021-09-25 NOTE — Interval H&P Note (Signed)
History and Physical Interval Note:  09/25/2021 2:27 PM  Chad York  has presented today for surgery, with the diagnosis of abnormal ct - angina.  The various methods of treatment have been discussed with the patient and family. After consideration of risks, benefits and other options for treatment, the patient has consented to  Procedure(s): RIGHT/LEFT HEART CATH AND CORONARY ANGIOGRAPHY (N/A) as a surgical intervention.  The patient's history has been reviewed, patient examined, no change in status, stable for surgery.  I have reviewed the patient's chart and labs.  Questions were answered to the patient's satisfaction.     Orbie Pyo

## 2021-09-26 ENCOUNTER — Encounter (HOSPITAL_COMMUNITY): Payer: Self-pay | Admitting: Internal Medicine

## 2021-10-02 DIAGNOSIS — H43391 Other vitreous opacities, right eye: Secondary | ICD-10-CM | POA: Diagnosis not present

## 2021-10-02 DIAGNOSIS — H35371 Puckering of macula, right eye: Secondary | ICD-10-CM | POA: Diagnosis not present

## 2021-10-02 DIAGNOSIS — H353231 Exudative age-related macular degeneration, bilateral, with active choroidal neovascularization: Secondary | ICD-10-CM | POA: Diagnosis not present

## 2021-10-02 DIAGNOSIS — H43813 Vitreous degeneration, bilateral: Secondary | ICD-10-CM | POA: Diagnosis not present

## 2021-10-07 NOTE — Progress Notes (Unsigned)
Cardiology Office Note:    Date:  10/10/2021   ID:  Chad York, DOB 07/30/1927, MRN [id number]  PCP:  Daisy Floro, MD   [facility] Providers Cardiologist:  Alverda Skeans, MD Referring MD: Daisy Floro, MD   Chief Complaint/Reason for Referral:  Chest pain  ASSESSMENT:    1. Coronary artery disease of native artery of native heart with stable angina pectoris (HCC)   2. Aortic valve disease   3. Type 2 diabetes mellitus with complication, without long-term current use of insulin (HCC)   4. Hypertension associated with diabetes (HCC)   5. Hyperlipidemia associated with type 2 diabetes mellitus (HCC)   6. Stage 3 chronic kidney disease, unspecified whether stage 3a or 3b CKD (HCC)   7. Aortic atherosclerosis (HCC)      PLAN:    In order of problems listed above: 1.  Coronary artery disease: The patient's symptoms are well controlled on his current regimen.  We again had a long discussion about his symptoms.  He has stable angina.  Given the complexity of disease and his advanced age we will pursue medical therapy until his symptoms are refractory to medical therapy. 2.  Aortic stenosis: He has moderate aortic stenosis.  Certainly if this were to get worse we would have to make a decision about valve intervention and perhaps PCI at that time.  He is not a candidate for the PROGRESS trial given his severe coronary artery. 3.  Type 2 diabetes: Continue ramipril, aspirin and atorvastatin.  Consider SGLT2 inhibitor. 4.  Hypertension: Blood pressure on my repeat measures 130/70.  Continue current medical therapy.     5.  Hyperlipidemia: Continue atorvastatin.  Check lipid panel and LFTs. 6.  Stage III chronic kidney disease: Continue ramipril for renal protection. 7.  Aortic atherosclerosis: Continue aspirin, statin, and strict blood pressure control.   Dispo:  Return in about 6 months (around 04/12/2022).      Medication Adjustments/Labs and Tests  Ordered: Current medicines are reviewed at length with the patient today.  Concerns regarding medicines are outlined above.  The following changes have been made:    Labs/tests ordered: No orders of the defined types were placed in this encounter.   Medication Changes: No orders of the defined types were placed in this encounter.    Current medicines are reviewed at length with the patient today.  The patient does not have concerns regarding medicines.   History of Present Illness:    FOCUSED PROBLEM LIST:   1.  Moderate aortic stenosis with an aortic valve area of 1.07 cm, mean gradient of 18 mmHg, and peak velocity of 2.8 m/s 2.  Hypertension 3.  Hyperlipidemia 4.  T2DM, diet controlled 5.  CKD stage 3  6.  Moderate to severe obstructive coronary artery disease with 50% distal left main, 75% ostial LAD, 50% mid LAD, 90% proximal circumflex, and 50% ostial right coronary artery disease on cardiac catheterization June 2023; pursuing medical therapy 7.  Aortic atherosclerosis on chest CT 2023  The patient returns for routine follow-up.  He recently underwent cardiac catheterization which demonstrated moderate to severe obstructive coronary artery disease.  Prior to this he was started on medical therapy and was relatively asymptomatic.  After review of coronary angiography and discussion with the patient, medical therapy was pursued.  The patient is here with his wife.  He is doing fairly well.  He has not began his exercise program yet.  He would like to do this.  He has not taken any sublingual nitroglycerin as he does not feel like he is needed it.  He has not had any issues with his medications.  He has not required any emergency room visits or hospitalizations.   Current Medications: Current Meds  Medication Sig   acetaminophen (TYLENOL) 325 MG tablet Take 325 mg by mouth every 6 (six) hours as needed for moderate pain.   amLODipine (NORVASC) 5 MG tablet Take 5 mg by mouth  daily.   Ascorbic Acid (VITAMIN C) 500 MG CAPS Take 500 mg by mouth daily.   aspirin EC 81 MG tablet Take 1 tablet (81 mg total) by mouth daily. Swallow whole.   atorvastatin (LIPITOR) 20 MG tablet Take 20 mg by mouth Daily.   bacitracin ophthalmic ointment Place 1 Application into both eyes See admin instructions. Apply to both eyes twice daily on the day before, the day off, and the day after eye injections   Carboxymethylcellul-Glycerin (LUBRICATING EYE DROPS OP) Place 1 drop into both eyes 2 (two) times daily as needed (dry eyes).   isosorbide mononitrate (IMDUR) 30 MG 24 hr tablet Take 1 tablet (30 mg total) by mouth at bedtime.   metoprolol succinate (TOPROL XL) 25 MG 24 hr tablet Take 0.5 tablets (12.5 mg total) by mouth at bedtime.   Multiple Vitamin (MULTIVITAMIN) tablet Take 1 tablet by mouth daily.   nitroGLYCERIN (NITROSTAT) 0.4 MG SL tablet Place 1 tablet (0.4 mg total) under the tongue every 5 (five) minutes as needed for chest pain.   ramipril (ALTACE) 10 MG capsule Take 10 mg by mouth Twice daily.   vitamin E 45 MG (100 UNITS) capsule Take 100 Units by mouth daily.   zolpidem (AMBIEN) 10 MG tablet Take 5 mg by mouth at bedtime as needed for sleep.     Allergies:    Ciprofloxacin, Metronidazole, and Sulfamethoxazole-trimethoprim   Social History:   Social History   Tobacco Use   Smoking status: Former    Types: Cigarettes    Quit date: 03/31/1985    Years since quitting: 36.5   Smokeless tobacco: Never  Vaping Use   Vaping Use: Never used  Substance Use Topics   Alcohol use: Yes    Comment: 4-6 DRINKS WEEKLY    Drug use: No     Family Hx: Family History  Problem Relation Age of Onset   Heart disease Father    Heart failure Father    Heart disease Brother        CABG   Stroke Brother    Heart disease Brother      Review of Systems:   Please see the history of present illness.    All other systems reviewed and are negative.     EKGs/Labs/Other Test  Reviewed:    EKG:  EKG performed November 2022 that I personally reviewed demonstrates sinus rhythm with fusion beats; EKG performed today that I personally reviewed demonstrates sinus rhythm with PVCs.  Prior CV studies:  Cardiac catheterization 07/06/21: Moderate to severe obstructive coronary artery disease with 50% distal left main, 75% ostial LAD, 50% mid LAD, 90% proximal circumflex, and 50% ostial right coronary artery disease on cardiac catheterization   TTE 07/06/21:  Moderate aortic stenosis with an aortic valve area of 1.07 cm, mean gradient of 18 mmHg, and peak velocity of 2.8 m/s with preserved ejection fraction   Other studies Reviewed: Review of the additional studies/records demonstrates: CT July 07, 2018 3 aortic atherosclerosis Recent Labs: 09/23/2021: BUN 21; Creatinine, Ser 1.18;  Platelets 209 09/25/2021: Hemoglobin 11.9; Potassium 3.5; Sodium 143   Recent Lipid Panel No results found for: "CHOL", "TRIG", "HDL", "LDLCALC", "LDLDIRECT"  Risk Assessment/Calculations:          Physical Exam:    VS:  BP 130/80   Pulse 74   Ht 5\' 2"  (1.575 m)   Wt 121 lb (54.9 kg)   SpO2 98%   BMI 22.13 kg/m    Wt Readings from Last 3 Encounters:  10/10/21 121 lb (54.9 kg)  09/25/21 127 lb (57.6 kg)  09/23/21 127 lb (57.6 kg)    GENERAL:  No apparent distress, Aox3, looks younger than stated age HEENT:  No carotid bruits, +2 carotid impulses, no scleral icterus CAR: Irregular RR with 2/6 SEM without gallops, rubs, or thrills RES:  Clear to auscultation bilaterally ABD:  Soft, nontender, nondistended, positive bowel sounds x 4 VASC:  +2 radial pulses, +2 carotid pulses, palpable pedal pulses NEURO:  CN 2-12 grossly intact; motor and sensory grossly intact PSYCH:  No active depression or anxiety EXT:  No edema, ecchymosis, or cyanosis  Signed, Early Osmond, MD  10/10/2021 10:57 AM    [address], [place], [place]  [address] Phone: [phone];  Fax: [phone]   Note:  This document was prepared using Dragon voice recognition software and may include unintentional dictation errors.

## 2021-10-10 ENCOUNTER — Ambulatory Visit (INDEPENDENT_AMBULATORY_CARE_PROVIDER_SITE_OTHER): Payer: Medicare Other | Admitting: Internal Medicine

## 2021-10-10 ENCOUNTER — Encounter: Payer: Self-pay | Admitting: Internal Medicine

## 2021-10-10 VITALS — BP 130/80 | HR 74 | Ht 62.0 in | Wt 121.0 lb

## 2021-10-10 DIAGNOSIS — I25118 Atherosclerotic heart disease of native coronary artery with other forms of angina pectoris: Secondary | ICD-10-CM | POA: Diagnosis not present

## 2021-10-10 DIAGNOSIS — E1169 Type 2 diabetes mellitus with other specified complication: Secondary | ICD-10-CM | POA: Diagnosis not present

## 2021-10-10 DIAGNOSIS — E1159 Type 2 diabetes mellitus with other circulatory complications: Secondary | ICD-10-CM

## 2021-10-10 DIAGNOSIS — I152 Hypertension secondary to endocrine disorders: Secondary | ICD-10-CM

## 2021-10-10 DIAGNOSIS — E118 Type 2 diabetes mellitus with unspecified complications: Secondary | ICD-10-CM

## 2021-10-10 DIAGNOSIS — N183 Chronic kidney disease, stage 3 unspecified: Secondary | ICD-10-CM

## 2021-10-10 DIAGNOSIS — I359 Nonrheumatic aortic valve disorder, unspecified: Secondary | ICD-10-CM | POA: Diagnosis not present

## 2021-10-10 DIAGNOSIS — I700 Atherosclerosis of aorta: Secondary | ICD-10-CM

## 2021-10-10 DIAGNOSIS — E785 Hyperlipidemia, unspecified: Secondary | ICD-10-CM

## 2021-10-10 NOTE — Patient Instructions (Signed)
Medication Instructions:  No changes *If you need a refill on your cardiac medications before your next appointment, please call your pharmacy*   Lab Work: none  Testing/Procedures: none   Follow-Up: At [facility], you and your health needs are our priority.  As part of our continuing mission to provide you with exceptional heart care, we have created designated Provider Care Teams.  These Care Teams include your primary Cardiologist (physician) and Advanced Practice Providers (APPs -  Physician Assistants and Nurse Practitioners) who all work together to provide you with the care you need, when you need it.   Your next appointment:   6 month(s)  The format for your next appointment:   In Person  Provider:   Orbie Pyo, MD     Important Information About [place]

## 2021-10-22 DIAGNOSIS — Z961 Presence of intraocular lens: Secondary | ICD-10-CM | POA: Diagnosis not present

## 2021-10-22 DIAGNOSIS — H353231 Exudative age-related macular degeneration, bilateral, with active choroidal neovascularization: Secondary | ICD-10-CM | POA: Diagnosis not present

## 2021-10-22 DIAGNOSIS — H35371 Puckering of macula, right eye: Secondary | ICD-10-CM | POA: Diagnosis not present

## 2021-10-22 DIAGNOSIS — H5203 Hypermetropia, bilateral: Secondary | ICD-10-CM | POA: Diagnosis not present

## 2021-10-22 DIAGNOSIS — H524 Presbyopia: Secondary | ICD-10-CM | POA: Diagnosis not present

## 2021-12-18 DIAGNOSIS — H353231 Exudative age-related macular degeneration, bilateral, with active choroidal neovascularization: Secondary | ICD-10-CM | POA: Diagnosis not present

## 2021-12-18 DIAGNOSIS — H3581 Retinal edema: Secondary | ICD-10-CM | POA: Diagnosis not present

## 2021-12-18 DIAGNOSIS — H35371 Puckering of macula, right eye: Secondary | ICD-10-CM | POA: Diagnosis not present

## 2021-12-18 DIAGNOSIS — H43813 Vitreous degeneration, bilateral: Secondary | ICD-10-CM | POA: Diagnosis not present

## 2021-12-30 DIAGNOSIS — Z23 Encounter for immunization: Secondary | ICD-10-CM | POA: Diagnosis not present

## 2022-03-05 DIAGNOSIS — H35371 Puckering of macula, right eye: Secondary | ICD-10-CM | POA: Diagnosis not present

## 2022-03-05 DIAGNOSIS — H353231 Exudative age-related macular degeneration, bilateral, with active choroidal neovascularization: Secondary | ICD-10-CM | POA: Diagnosis not present

## 2022-03-05 DIAGNOSIS — H43813 Vitreous degeneration, bilateral: Secondary | ICD-10-CM | POA: Diagnosis not present

## 2022-03-13 DIAGNOSIS — H6123 Impacted cerumen, bilateral: Secondary | ICD-10-CM | POA: Diagnosis not present

## 2022-03-13 DIAGNOSIS — Z974 Presence of external hearing-aid: Secondary | ICD-10-CM | POA: Diagnosis not present

## 2022-04-03 DIAGNOSIS — I10 Essential (primary) hypertension: Secondary | ICD-10-CM | POA: Diagnosis not present

## 2022-04-03 DIAGNOSIS — Z1389 Encounter for screening for other disorder: Secondary | ICD-10-CM | POA: Diagnosis not present

## 2022-04-03 DIAGNOSIS — I2089 Other forms of angina pectoris: Secondary | ICD-10-CM | POA: Diagnosis not present

## 2022-04-03 DIAGNOSIS — Z6822 Body mass index (BMI) 22.0-22.9, adult: Secondary | ICD-10-CM | POA: Diagnosis not present

## 2022-04-03 DIAGNOSIS — Z0000 Encounter for general adult medical examination without abnormal findings: Secondary | ICD-10-CM | POA: Diagnosis not present

## 2022-04-03 DIAGNOSIS — E1122 Type 2 diabetes mellitus with diabetic chronic kidney disease: Secondary | ICD-10-CM | POA: Diagnosis not present

## 2022-04-03 DIAGNOSIS — G479 Sleep disorder, unspecified: Secondary | ICD-10-CM | POA: Diagnosis not present

## 2022-04-03 DIAGNOSIS — N183 Chronic kidney disease, stage 3 unspecified: Secondary | ICD-10-CM | POA: Diagnosis not present

## 2022-04-03 DIAGNOSIS — E7800 Pure hypercholesterolemia, unspecified: Secondary | ICD-10-CM | POA: Diagnosis not present

## 2022-04-08 NOTE — Progress Notes (Signed)
Cardiology Office Note:    Date:  04/14/2022   ID:  Chad York, DOB [birth year], MRN [id number]  PCP:  Daisy Floro, MD   [facility] Providers Cardiologist:  Alverda Skeans, MD Referring MD: Daisy Floro, MD   Chief Complaint/Reason for Referral:  Chest pain  ASSESSMENT:    1. Coronary artery disease of native artery of native heart with stable angina pectoris (HCC)   2. Aortic valve disease   3. Type 2 diabetes mellitus with complication, without long-term current use of insulin (HCC)   4. Hypertension associated with diabetes (HCC)   5. Hyperlipidemia associated with type 2 diabetes mellitus (HCC)   6. Stage 3 chronic kidney disease, unspecified whether stage 3a or 3b CKD (HCC)   7. Aortic atherosclerosis (HCC)       PLAN:    In order of problems listed above: 1.  Coronary artery disease: He has stable chronic angina and needs occasional as needed nitroglycerin.  We will increase his Toprol-XL to 25 mg at bedtime for improved blood pressure and heart rate control. 2.  Aortic stenosis: His murmur seems to be more pronounced today.  Will obtain an echocardiogram to evaluate further.  Prior echocardiograms have demonstrated moderate aortic stenosis.  He is not a candidate for the PROGRESS trial given his severe coronary artery. 3.  Type 2 diabetes: Continue ramipril, aspirin and atorvastatin.  Start Jardiance 10mg  daily; GFR > 50 4.  Hypertension: Will increase Toprol to 25 mg. 5.  Hyperlipidemia: Continue atorvastatin.  Check lipid panel, LFTs, and Lp(a) today. 6.  Stage III chronic kidney disease: Continue ramipril for renal protection.  Start Jardiance 10mg  daily. 7.  Aortic atherosclerosis: Continue aspirin, statin, and strict blood pressure control.   Dispo:  Return in about 6 months (around 10/13/2022).      Medication Adjustments/Labs and Tests Ordered: Current medicines are reviewed at length with the patient today.  Concerns regarding medicines are  outlined above.  The following changes have been made:    Labs/tests ordered: Orders Placed This Encounter  Procedures   Lipoprotein A (LPA)   Lipid panel   Hepatic function panel   ECHOCARDIOGRAM COMPLETE    Medication Changes: Meds ordered this encounter  Medications   metoprolol succinate (TOPROL XL) 25 MG 24 hr tablet    Sig: Take 1 tablet (25 mg total) by mouth at bedtime.    Dispense:  90 tablet    Refill:  3    Dose increase     Current medicines are reviewed at length with the patient today.  The patient does not have concerns regarding medicines.   History of Present Illness:    FOCUSED PROBLEM LIST:   1.  Moderate aortic stenosis with an aortic valve area of 1.07 cm, mean gradient of 18 mmHg, and peak velocity of 2.8 m/s 2.  Hypertension 3.  Hyperlipidemia 4.  T2DM, diet controlled 5.  CKD stage 3  6.  Moderate to severe obstructive coronary artery disease with 50% distal left main, 75% ostial LAD, 50% mid LAD, 90% proximal circumflex, and 50% ostial right coronary artery disease on cardiac catheterization June 2023; pursuing medical therapy 7.  Aortic atherosclerosis on chest CT 2023  July 2023:  The patient returns for routine follow-up.  He recently underwent cardiac catheterization which demonstrated moderate to severe obstructive coronary artery disease.  Prior to this he was started on medical therapy and was relatively asymptomatic.  After review of coronary angiography and discussion with the  patient, medical therapy was pursued.  The patient is here with his wife.  He is doing fairly well.  He has not began his exercise program yet.  He would like to do this.  He has not taken any sublingual nitroglycerin as he does not feel like he is needed it.  He has not had any issues with his medications.  He has not required any emergency room visits or hospitalizations.  Plan:  Continue medical therapy, check lipids/LFTs/Lp(a); consider SGLT2i in future.  Today:  The patient continues to do well.  He endorses chronic stable angina.  He will occasionally need as needed nitroglycerin when he exerts himself.  At times he will get a chest discomfort when he lays down.  This seems to be alleviated by burping.  This discomfort has resolved over the last few weeks.  He denies any severe bleeding or bruising, chest pain at rest, presyncope, or syncope.   Current Medications: Current Meds  Medication Sig   amLODipine (NORVASC) 5 MG tablet Take 5 mg by mouth daily.   aspirin EC 81 MG tablet Take 1 tablet (81 mg total) by mouth daily. Swallow whole.   atorvastatin (LIPITOR) 20 MG tablet Take 20 mg by mouth Daily.   bacitracin ophthalmic ointment Place 1 Application into both eyes See admin instructions. Apply to both eyes twice daily on the day before, the day off, and the day after eye injections   Carboxymethylcellul-Glycerin (LUBRICATING EYE DROPS OP) Place 1 drop into both eyes 2 (two) times daily as needed (dry eyes).   isosorbide mononitrate (IMDUR) 30 MG 24 hr tablet Take 1 tablet (30 mg total) by mouth at bedtime.   Multiple Vitamin (MULTIVITAMIN) tablet Take 1 tablet by mouth daily.   nitroGLYCERIN (NITROSTAT) 0.4 MG SL tablet Place 1 tablet (0.4 mg total) under the tongue every 5 (five) minutes as needed for chest pain.   ramipril (ALTACE) 10 MG capsule Take 10 mg by mouth Twice daily.   vitamin E 45 MG (100 UNITS) capsule Take 100 Units by mouth daily.   zolpidem (AMBIEN) 10 MG tablet Take 5 mg by mouth at bedtime as needed for sleep.   [DISCONTINUED] metoprolol succinate (TOPROL XL) 25 MG 24 hr tablet Take 0.5 tablets (12.5 mg total) by mouth at bedtime.     Allergies:    Ciprofloxacin, Metronidazole, and Sulfamethoxazole-trimethoprim   Social History:   Social History   Tobacco Use   Smoking status: Former    Types: Cigarettes    Quit date: 03/31/1985    Years since quitting: 37.0   Smokeless tobacco: Never  Vaping Use   Vaping Use: Never  used  Substance Use Topics   Alcohol use: Yes    Comment: 4-6 DRINKS WEEKLY    Drug use: No     Family Hx: Family History  Problem Relation Age of Onset   Heart disease Father    Heart failure Father    Heart disease Brother        CABG   Stroke Brother    Heart disease Brother      Review of Systems:   Please see the history of present illness.    All other systems reviewed and are negative.     EKGs/Labs/Other Test Reviewed:    EKG:  EKG performed November 2022 that I personally reviewed demonstrates sinus rhythm with fusion beats; EKG performed today that I personally reviewed demonstrates sinus rhythm with PVCs.  Prior CV studies:  Cardiac catheterization 2023: Moderate  to severe obstructive coronary artery disease with 50% distal left main, 75% ostial LAD, 50% mid LAD, 90% proximal circumflex, and 50% ostial right coronary artery disease on cardiac catheterization   TTE 07-29-21:  Moderate aortic stenosis with an aortic valve area of 1.07 cm, mean gradient of 18 mmHg, and peak velocity of 2.8 m/s with preserved ejection fraction   Other studies Reviewed: Review of the additional studies/records demonstrates: CT Jul 30, 2018 3 aortic atherosclerosis Recent Labs: 09/23/2021: BUN 21; Creatinine, Ser 1.18; Platelets 209 09/25/2021: Hemoglobin 11.9; Potassium 3.5; Sodium 143   Recent Lipid Panel No results found for: "CHOL", "TRIG", "HDL", "LDLCALC", "LDLDIRECT"  Risk Assessment/Calculations:          Physical Exam:    VS:  BP (!) 145/65   Pulse 97   Ht 5\' 2"  (1.575 m)   Wt 123 lb 6.4 oz (56 kg)   SpO2 98%   BMI 22.57 kg/m   HYPERTENSION CONTROL Vitals:   04/14/22 1523 04/14/22 1546  BP: (!) 166/68 (!) 145/65    The patient's blood pressure is elevated above target today.  In order to address the patient's elevated BP: A current anti-hypertensive medication was adjusted today.      Wt Readings from Last 3 Encounters:  04/14/22 123 lb 6.4 oz (56 kg)  10/10/21  121 lb (54.9 kg)  09/25/21 127 lb (57.6 kg)    GENERAL:  No apparent distress, Aox3, looks younger than stated age HEENT:  No carotid bruits, +2 carotid impulses, no scleral icterus CAR: Irregular RR with 3/6 SEM without gallops, rubs, or thrills RES:  Clear to auscultation bilaterally ABD:  Soft, nontender, nondistended, positive bowel sounds x 4 VASC:  +2 radial pulses, +2 carotid pulses, palpable pedal pulses NEURO:  CN 2-12 grossly intact; motor and sensory grossly intact PSYCH:  No active depression or anxiety EXT:  No edema, ecchymosis, or cyanosis  Signed, 09/27/21, MD  04/14/2022 4:24 PM    [facility] [address] [place], [place], [place]  [place] Phone: [phone]; Fax: [phone]   Note:  This document was prepared using Dragon voice recognition software and may include unintentional dictation errors.

## 2022-04-14 ENCOUNTER — Ambulatory Visit: Payer: Medicare Other | Attending: Internal Medicine | Admitting: Internal Medicine

## 2022-04-14 ENCOUNTER — Encounter: Payer: Self-pay | Admitting: Internal Medicine

## 2022-04-14 VITALS — BP 145/65 | HR 97 | Ht 62.0 in | Wt 123.4 lb

## 2022-04-14 DIAGNOSIS — I25118 Atherosclerotic heart disease of native coronary artery with other forms of angina pectoris: Secondary | ICD-10-CM | POA: Diagnosis not present

## 2022-04-14 DIAGNOSIS — I700 Atherosclerosis of aorta: Secondary | ICD-10-CM | POA: Diagnosis not present

## 2022-04-14 DIAGNOSIS — E1169 Type 2 diabetes mellitus with other specified complication: Secondary | ICD-10-CM | POA: Insufficient documentation

## 2022-04-14 DIAGNOSIS — I359 Nonrheumatic aortic valve disorder, unspecified: Secondary | ICD-10-CM

## 2022-04-14 DIAGNOSIS — E785 Hyperlipidemia, unspecified: Secondary | ICD-10-CM

## 2022-04-14 DIAGNOSIS — I152 Hypertension secondary to endocrine disorders: Secondary | ICD-10-CM | POA: Insufficient documentation

## 2022-04-14 DIAGNOSIS — E118 Type 2 diabetes mellitus with unspecified complications: Secondary | ICD-10-CM | POA: Diagnosis not present

## 2022-04-14 DIAGNOSIS — E1159 Type 2 diabetes mellitus with other circulatory complications: Secondary | ICD-10-CM

## 2022-04-14 DIAGNOSIS — N183 Chronic kidney disease, stage 3 unspecified: Secondary | ICD-10-CM | POA: Diagnosis not present

## 2022-04-14 MED ORDER — METOPROLOL SUCCINATE ER 25 MG PO TB24: 25.0000 mg | ORAL_TABLET | Freq: Every day | ORAL | 3 refills | Status: DC

## 2022-04-14 NOTE — Patient Instructions (Signed)
Medication Instructions:  Your physician has recommended you make the following change in your medication:  1.) increase Toprol XL (metoprolol succinate) to 25 mg (whole tablet) daily at bedtime  *If you need a refill on your cardiac medications before your next appointment, please call your pharmacy*   Lab Work: Today: lipids/liver function/LP(a)  If you have labs (blood work) drawn today and your tests are completely normal, you will receive your results only by: [place] (if you have MyChart) OR A paper copy in the mail If you have any lab test that is abnormal or we need to change your treatment, we will call you to review the results.   Testing/Procedures: Your physician has requested that you have an echocardiogram. Echocardiography is a painless test that uses sound waves to create images of your heart. It provides your doctor with information about the size and shape of your heart and how well your heart's chambers and valves are working. This procedure takes approximately one hour. There are no restrictions for this procedure. Please do NOT wear cologne, perfume, aftershave, or lotions (deodorant is allowed). Please arrive 15 minutes prior to your appointment time.   Follow-Up: At [facility], you and your health needs are our priority.  As part of our continuing mission to provide you with exceptional heart care, we have created designated Provider Care Teams.  These Care Teams include your primary Cardiologist (physician) and Advanced Practice Providers (APPs -  Physician Assistants and Nurse Practitioners) who all work together to provide you with the care you need, when you need it.   Your next appointment:   6 month(s)  Provider:   Early Osmond, MD

## 2022-04-15 LAB — HEPATIC FUNCTION PANEL
ALT: 15 IU/L (ref 0–44)
AST: 22 IU/L (ref 0–40)
Albumin: 4.4 g/dL (ref 3.6–4.6)
Alkaline Phosphatase: 98 IU/L (ref 44–121)
Bilirubin Total: 0.5 mg/dL (ref 0.0–1.2)
Bilirubin, Direct: 0.18 mg/dL (ref 0.00–0.40)
Total Protein: 6.8 g/dL (ref 6.0–8.5)

## 2022-04-15 LAB — LIPID PANEL
Chol/HDL Ratio: 2.3 ratio (ref 0.0–5.0)
Cholesterol, Total: 120 mg/dL (ref 100–199)
HDL: 52 mg/dL (ref >39)
LDL Chol Calc (NIH): 53 mg/dL (ref 0–99)
Triglycerides: 70 mg/dL (ref 0–149)
VLDL Cholesterol Cal: 15 mg/dL (ref 5–40)

## 2022-04-15 LAB — LIPOPROTEIN A (LPA): Lipoprotein (a): 9.1 nmol/L (ref <75.0)

## 2022-04-17 ENCOUNTER — Ambulatory Visit (HOSPITAL_COMMUNITY): Payer: Medicare Other | Attending: Internal Medicine

## 2022-04-17 DIAGNOSIS — I700 Atherosclerosis of aorta: Secondary | ICD-10-CM

## 2022-04-17 DIAGNOSIS — N183 Chronic kidney disease, stage 3 unspecified: Secondary | ICD-10-CM | POA: Insufficient documentation

## 2022-04-17 DIAGNOSIS — E1159 Type 2 diabetes mellitus with other circulatory complications: Secondary | ICD-10-CM | POA: Diagnosis not present

## 2022-04-17 DIAGNOSIS — E1169 Type 2 diabetes mellitus with other specified complication: Secondary | ICD-10-CM | POA: Diagnosis not present

## 2022-04-17 DIAGNOSIS — I351 Nonrheumatic aortic (valve) insufficiency: Secondary | ICD-10-CM | POA: Diagnosis not present

## 2022-04-17 DIAGNOSIS — I152 Hypertension secondary to endocrine disorders: Secondary | ICD-10-CM

## 2022-04-17 DIAGNOSIS — I25118 Atherosclerotic heart disease of native coronary artery with other forms of angina pectoris: Secondary | ICD-10-CM

## 2022-04-17 DIAGNOSIS — I350 Nonrheumatic aortic (valve) stenosis: Secondary | ICD-10-CM

## 2022-04-17 DIAGNOSIS — I359 Nonrheumatic aortic valve disorder, unspecified: Secondary | ICD-10-CM

## 2022-04-17 DIAGNOSIS — E118 Type 2 diabetes mellitus with unspecified complications: Secondary | ICD-10-CM | POA: Diagnosis not present

## 2022-04-17 DIAGNOSIS — E785 Hyperlipidemia, unspecified: Secondary | ICD-10-CM | POA: Diagnosis not present

## 2022-04-17 LAB — ECHOCARDIOGRAM COMPLETE
AR max vel: 0.79 cm2
AV Area VTI: 0.87 cm2
AV Area mean vel: 0.75 cm2
AV Mean grad: 19 mmHg
AV Peak grad: 33.2 mmHg
Ao pk vel: 2.88 m/s
Area-P 1/2: 3.21 cm2
MV M vel: 4.86 m/s
MV Peak grad: 94.5 mmHg
S' Lateral: 2.8 cm

## 2022-04-18 ENCOUNTER — Telehealth: Payer: Self-pay | Admitting: Internal Medicine

## 2022-04-18 NOTE — Telephone Encounter (Signed)
Early Osmond, MD 04/18/2022  6:46 AM EST     Please cancel 6 month follow up and have him see me in 3-4 months.    Called patient and scheduled sooner follow up. 07/21/22.

## 2022-04-18 NOTE — Progress Notes (Unsigned)
Opened in error

## 2022-04-18 NOTE — Telephone Encounter (Signed)
Pt calling for lab results.

## 2022-05-30 DIAGNOSIS — H43813 Vitreous degeneration, bilateral: Secondary | ICD-10-CM | POA: Diagnosis not present

## 2022-05-30 DIAGNOSIS — H353231 Exudative age-related macular degeneration, bilateral, with active choroidal neovascularization: Secondary | ICD-10-CM | POA: Diagnosis not present

## 2022-05-30 DIAGNOSIS — H35371 Puckering of macula, right eye: Secondary | ICD-10-CM | POA: Diagnosis not present

## 2022-06-16 ENCOUNTER — Other Ambulatory Visit: Payer: Self-pay

## 2022-06-16 ENCOUNTER — Encounter (HOSPITAL_COMMUNITY): Payer: Self-pay

## 2022-06-16 ENCOUNTER — Emergency Department (HOSPITAL_COMMUNITY)
Admission: EM | Admit: 2022-06-16 | Discharge: 2022-06-30 | Disposition: E | Payer: Medicare Other | Attending: Emergency Medicine | Admitting: Emergency Medicine

## 2022-06-16 ENCOUNTER — Emergency Department (HOSPITAL_COMMUNITY): Payer: Medicare Other

## 2022-06-16 DIAGNOSIS — I129 Hypertensive chronic kidney disease with stage 1 through stage 4 chronic kidney disease, or unspecified chronic kidney disease: Secondary | ICD-10-CM | POA: Diagnosis not present

## 2022-06-16 DIAGNOSIS — R739 Hyperglycemia, unspecified: Secondary | ICD-10-CM | POA: Diagnosis not present

## 2022-06-16 DIAGNOSIS — N189 Chronic kidney disease, unspecified: Secondary | ICD-10-CM | POA: Diagnosis not present

## 2022-06-16 DIAGNOSIS — Z7982 Long term (current) use of aspirin: Secondary | ICD-10-CM | POA: Insufficient documentation

## 2022-06-16 DIAGNOSIS — J9601 Acute respiratory failure with hypoxia: Secondary | ICD-10-CM

## 2022-06-16 DIAGNOSIS — Z20822 Contact with and (suspected) exposure to covid-19: Secondary | ICD-10-CM | POA: Insufficient documentation

## 2022-06-16 DIAGNOSIS — R7989 Other specified abnormal findings of blood chemistry: Secondary | ICD-10-CM | POA: Diagnosis not present

## 2022-06-16 DIAGNOSIS — I10 Essential (primary) hypertension: Secondary | ICD-10-CM | POA: Diagnosis not present

## 2022-06-16 DIAGNOSIS — E1122 Type 2 diabetes mellitus with diabetic chronic kidney disease: Secondary | ICD-10-CM | POA: Insufficient documentation

## 2022-06-16 DIAGNOSIS — D72829 Elevated white blood cell count, unspecified: Secondary | ICD-10-CM | POA: Diagnosis not present

## 2022-06-16 DIAGNOSIS — J101 Influenza due to other identified influenza virus with other respiratory manifestations: Secondary | ICD-10-CM | POA: Insufficient documentation

## 2022-06-16 DIAGNOSIS — A419 Sepsis, unspecified organism: Secondary | ICD-10-CM

## 2022-06-16 DIAGNOSIS — Z79899 Other long term (current) drug therapy: Secondary | ICD-10-CM | POA: Diagnosis not present

## 2022-06-16 DIAGNOSIS — I214 Non-ST elevation (NSTEMI) myocardial infarction: Secondary | ICD-10-CM

## 2022-06-16 DIAGNOSIS — R0602 Shortness of breath: Secondary | ICD-10-CM | POA: Diagnosis not present

## 2022-06-16 DIAGNOSIS — R0902 Hypoxemia: Secondary | ICD-10-CM | POA: Diagnosis not present

## 2022-06-16 DIAGNOSIS — R6520 Severe sepsis without septic shock: Secondary | ICD-10-CM | POA: Diagnosis not present

## 2022-06-16 DIAGNOSIS — R000 Tachycardia, unspecified: Secondary | ICD-10-CM | POA: Diagnosis not present

## 2022-06-16 LAB — BASIC METABOLIC PANEL
Anion gap: 17 — ABNORMAL HIGH (ref 5–15)
BUN: 24 mg/dL — ABNORMAL HIGH (ref 8–23)
CO2: 15 mmol/L — ABNORMAL LOW (ref 22–32)
Calcium: 8.4 mg/dL — ABNORMAL LOW (ref 8.9–10.3)
Chloride: 101 mmol/L (ref 98–111)
Creatinine, Ser: 1.65 mg/dL — ABNORMAL HIGH (ref 0.61–1.24)
GFR, Estimated: 39 mL/min — ABNORMAL LOW (ref >60)
Glucose, Bld: 218 mg/dL — ABNORMAL HIGH (ref 70–99)
Potassium: 4 mmol/L (ref 3.5–5.1)
Sodium: 133 mmol/L — ABNORMAL LOW (ref 135–145)

## 2022-06-16 LAB — RESP PANEL BY RT-PCR (RSV, FLU A&B, COVID)  RVPGX2
Influenza A by PCR: POSITIVE — AB
Influenza B by PCR: NEGATIVE
Resp Syncytial Virus by PCR: NEGATIVE
SARS Coronavirus 2 by RT PCR: NEGATIVE

## 2022-06-16 LAB — CBC WITH DIFFERENTIAL/PLATELET
Abs Immature Granulocytes: 0.04 10*3/uL (ref 0.00–0.07)
Basophils Absolute: 0 10*3/uL (ref 0.0–0.1)
Basophils Relative: 0 %
Eosinophils Absolute: 0 10*3/uL (ref 0.0–0.5)
Eosinophils Relative: 0 %
HCT: 46.3 % (ref 39.0–52.0)
Hemoglobin: 15.6 g/dL (ref 13.0–17.0)
Immature Granulocytes: 0 %
Lymphocytes Relative: 8 %
Lymphs Abs: 1.1 10*3/uL (ref 0.7–4.0)
MCH: 32.6 pg (ref 26.0–34.0)
MCHC: 33.7 g/dL (ref 30.0–36.0)
MCV: 96.7 fL (ref 80.0–100.0)
Monocytes Absolute: 0.7 10*3/uL (ref 0.1–1.0)
Monocytes Relative: 6 %
Neutro Abs: 10.9 10*3/uL — ABNORMAL HIGH (ref 1.7–7.7)
Neutrophils Relative %: 86 %
Platelets: DECREASED 10*3/uL (ref 150–400)
RBC: 4.79 MIL/uL (ref 4.22–5.81)
RDW: 12.8 % (ref 11.5–15.5)
WBC: 12.7 10*3/uL — ABNORMAL HIGH (ref 4.0–10.5)
nRBC: 0 % (ref 0.0–0.2)

## 2022-06-16 LAB — TROPONIN I (HIGH SENSITIVITY): Troponin I (High Sensitivity): 18989 ng/L (ref <18)

## 2022-06-16 LAB — BRAIN NATRIURETIC PEPTIDE: B Natriuretic Peptide: 1449.1 pg/mL — ABNORMAL HIGH (ref 0.0–100.0)

## 2022-06-16 LAB — LACTIC ACID, PLASMA: Lactic Acid, Venous: 7.1 mmol/L (ref 0.5–1.9)

## 2022-06-16 MED ORDER — MORPHINE SULFATE (PF) 4 MG/ML IV SOLN
4.0000 mg | Freq: Once | INTRAVENOUS | Status: AC
  Administered 2022-06-16: 4 mg via INTRAVENOUS
  Filled 2022-06-16: qty 1

## 2022-06-16 MED ORDER — LACTATED RINGERS IV BOLUS (SEPSIS)
500.0000 mL | Freq: Once | INTRAVENOUS | Status: AC
  Administered 2022-06-16: 500 mL via INTRAVENOUS

## 2022-06-16 MED ORDER — SODIUM CHLORIDE 0.9 % IV SOLN
2.0000 g | INTRAVENOUS | Status: DC
  Filled 2022-06-16: qty 20

## 2022-06-16 MED ORDER — LACTATED RINGERS IV SOLN: INTRAVENOUS | Status: DC

## 2022-06-16 MED ORDER — LACTATED RINGERS IV BOLUS (SEPSIS)
1000.0000 mL | Freq: Once | INTRAVENOUS | Status: AC
  Administered 2022-06-16: 1000 mL via INTRAVENOUS

## 2022-06-16 MED ORDER — LACTATED RINGERS IV BOLUS (SEPSIS)
250.0000 mL | Freq: Once | INTRAVENOUS | Status: AC
  Administered 2022-06-16: 250 mL via INTRAVENOUS

## 2022-06-16 MED ORDER — ACETAMINOPHEN 650 MG RE SUPP
650.0000 mg | Freq: Once | RECTAL | Status: AC
  Administered 2022-06-16: 650 mg via RECTAL
  Filled 2022-06-16: qty 1

## 2022-06-16 MED ORDER — SODIUM CHLORIDE 0.9 % IV SOLN
500.0000 mg | INTRAVENOUS | Status: DC
  Administered 2022-06-16: 500 mg via INTRAVENOUS
  Filled 2022-06-16: qty 5

## 2022-06-21 LAB — CULTURE, BLOOD (ROUTINE X 2)
Culture: NO GROWTH
Culture: NO GROWTH
Special Requests: ADEQUATE
Special Requests: ADEQUATE

## 2022-06-30 NOTE — ED Notes (Signed)
Wife at bedside.

## 2022-06-30 NOTE — ED Provider Notes (Signed)
[facility] Provider Note   CSN: [id number] Arrival date & time: June 22, 2022  1046     History  Chief Complaint  Patient presents with   Sob/Bipap   Shortness of Breath    Chad York is a 89 y.o. male.  Pt is a 89 yo male with pmhx significant for aortic stenosis, DM2, HLD, HTN, and CKD.  Pt has had sob and cold sx for 3 days.  He's been taking cough drops.  This am, sob worsened, so he called EMS.  Pt's RA O2 sat 84%, so he was put on NRB at 15L and then transitioned to CPAP with EMS.  CPAP is helping.  Pt was given 125 mg solumedrol IV as well.  Pt has had fever.         Home Medications Prior to Admission medications   Medication Sig Start Date End Date Taking? Authorizing Provider  acetaminophen (TYLENOL) 325 MG tablet Take 325 mg by mouth every 6 (six) hours as needed for moderate pain. Patient not taking: Reported on 04/14/2022    [provider]  amLODipine (NORVASC) 5 MG tablet Take 5 mg by mouth daily. 09/02/21   [provider]  Ascorbic Acid (VITAMIN C) 500 MG CAPS Take 500 mg by mouth daily. Patient not taking: Reported on 04/14/2022    [provider]  aspirin EC 81 MG tablet Take 1 tablet (81 mg total) by mouth daily. Swallow whole. 09/23/21   Swinyer, Lanice Schwab, NP  atorvastatin (LIPITOR) 20 MG tablet Take 20 mg by mouth Daily. 09/30/11   [provider]  bacitracin ophthalmic ointment Place 1 Application into both eyes See admin instructions. Apply to both eyes twice daily on the day before, the day off, and the day after eye injections    [provider]  Carboxymethylcellul-Glycerin (LUBRICATING EYE DROPS OP) Place 1 drop into both eyes 2 (two) times daily as needed (dry eyes).    [provider]  isosorbide mononitrate (IMDUR) 30 MG 24 hr tablet Take 1 tablet (30 mg total) by mouth at bedtime. 08/19/21   Early Osmond, MD  metoprolol succinate (TOPROL XL) 25 MG 24  hr tablet Take 1 tablet (25 mg total) by mouth at bedtime. 04/14/22   Early Osmond, MD  Multiple Vitamin (MULTIVITAMIN) tablet Take 1 tablet by mouth daily.    [provider]  nitroGLYCERIN (NITROSTAT) 0.4 MG SL tablet Place 1 tablet (0.4 mg total) under the tongue every 5 (five) minutes as needed for chest pain. 08/19/21   Early Osmond, MD  ramipril (ALTACE) 10 MG capsule Take 10 mg by mouth Twice daily. 09/09/11   [provider]  vitamin E 45 MG (100 UNITS) capsule Take 100 Units by mouth daily.    [provider]  zolpidem (AMBIEN) 10 MG tablet Take 5 mg by mouth at bedtime as needed for sleep. 10/12/12   [provider]      Allergies    Ciprofloxacin, Metronidazole, and Sulfamethoxazole-trimethoprim    Review of Systems   Review of Systems  HENT:  Positive for rhinorrhea.   Respiratory:  Positive for cough and shortness of breath.   All other systems reviewed and are negative.   Physical Exam Updated Vital Signs BP 136/83   Pulse (!) 121   Temp (!) 100.6 F (38.1 C) (Axillary)   Resp (!) 27   SpO2 96%  Physical Exam Vitals and nursing note reviewed.  Constitutional:  General: He is in acute distress.     Appearance: He is well-developed.  HENT:     Head: Normocephalic and atraumatic.     Mouth/Throat:     Mouth: Mucous membranes are moist.     Pharynx: Oropharynx is clear.  Eyes:     Extraocular Movements: Extraocular movements intact.     Pupils: Pupils are equal, round, and reactive to light.  Cardiovascular:     Rate and Rhythm: Regular rhythm. Tachycardia present.     Heart sounds: Murmur heard.  Pulmonary:     Effort: Tachypnea present.     Breath sounds: Rhonchi and rales present.  Abdominal:     General: Bowel sounds are normal.     Palpations: Abdomen is soft.  Musculoskeletal:        General: Normal range of motion.     Cervical back: Normal range of motion and neck supple.  Skin:    General: Skin is warm.      Capillary Refill: Capillary refill takes less than 2 seconds.  Neurological:     General: No focal deficit present.     Mental Status: He is alert and oriented to person, place, and time.  Psychiatric:        Mood and Affect: Mood normal.        Behavior: Behavior normal.     ED Results / Procedures / Treatments   Labs (all labs ordered are listed, but only abnormal results are displayed) Labs Reviewed  BASIC METABOLIC PANEL - Abnormal; Notable for the following components:      Result Value   Sodium 133 (*)    CO2 15 (*)    Glucose, Bld 218 (*)    BUN 24 (*)    Creatinine, Ser 1.65 (*)    Calcium 8.4 (*)    GFR, Estimated 39 (*)    Anion gap 17 (*)    All other components within normal limits  CBC WITH DIFFERENTIAL/PLATELET - Abnormal; Notable for the following components:   WBC 12.7 (*)    Neutro Abs 10.9 (*)    All other components within normal limits  BRAIN NATRIURETIC PEPTIDE - Abnormal; Notable for the following components:   B Natriuretic Peptide 1,449.1 (*)    All other components within normal limits  LACTIC ACID, PLASMA - Abnormal; Notable for the following components:   Lactic Acid, Venous 7.1 (*)    All other components within normal limits  TROPONIN I (HIGH SENSITIVITY) - Abnormal; Notable for the following components:   Troponin I (High Sensitivity) 18,989 (*)    All other components within normal limits  CULTURE, BLOOD (ROUTINE X 2)  CULTURE, BLOOD (ROUTINE X 2)  RESP PANEL BY RT-PCR (RSV, FLU A&B, COVID)  RVPGX2  LACTIC ACID, PLASMA  TROPONIN I (HIGH SENSITIVITY)    EKG EKG Interpretation  Date/Time:  July 13, 2022 10:58:29 EDT Ventricular Rate:  127 PR Interval:  151 QRS Duration: 112 QT Interval:  303 QTC Calculation: 441 R Axis:   -59 Text Interpretation: Sinus tachycardia Atrial premature complex LAD, consider left anterior fascicular block Repolarization abnormality, prob rate related QRS duration has lengthened Confirmed by  Isla Pence [phone]) on 13-Jul-2022 11:05:53 AM  Radiology DG Chest Port 1 View  Result Date: 2022-07-13 CLINICAL DATA:  Shortness of breath EXAM: PORTABLE CHEST 1 VIEW COMPARISON:  04/23/2004, 09/10/2021 FINDINGS: The heart size and mediastinal contours are within normal limits. Aortic atherosclerosis. Bilateral airspace opacities most pronounced in the bilateral perihilar regions  and left lung base. No pleural effusion or pneumothorax. The visualized skeletal structures are unremarkable. IMPRESSION: Bilateral airspace opacities most pronounced in the bilateral perihilar regions and left lung base, which may represent pulmonary edema versus multifocal pneumonia. Electronically Signed   By: Davina Poke D.O.   On: 06-18-2022 11:17    Procedures Procedures    Medications Ordered in ED Medications  lactated ringers infusion (has no administration in time range)  cefTRIAXone (ROCEPHIN) 2 g in sodium chloride 0.9 % 100 mL IVPB (has no administration in time range)  azithromycin (ZITHROMAX) 500 mg in sodium chloride 0.9 % 250 mL IVPB (0 mg Intravenous Stopped 2022/06/18 1243)  acetaminophen (TYLENOL) suppository 650 mg (650 mg Rectal Given 06/18/2022 1142)  lactated ringers bolus 1,000 mL (0 mLs Intravenous Stopped June 18, 2022 1241)    And  lactated ringers bolus 500 mL (0 mLs Intravenous Stopped 06/18/2022 1241)    And  lactated ringers bolus 250 mL (0 mLs Intravenous Stopped 2022-06-18 1241)  morphine (PF) 4 MG/ML injection 4 mg (4 mg Intravenous Given June 18, 2022 1236)    ED Course/ Medical Decision Making/ A&P                             Medical Decision Making Amount and/or Complexity of Data Reviewed Labs: ordered. Radiology: ordered.  Risk OTC drugs. Prescription drug management.   This patient presents to the ED for concern of sob, this involves an extensive number of treatment options, and is a complaint that carries with it a high risk of complications and morbidity.  The differential  diagnosis includes covid/flu/rsv, pna, PE   Co morbidities that complicate the patient evaluation  aortic stenosis, DM2, HLD, HTN, and CKD   Additional history obtained:  Additional history obtained from epic chart review External records from outside source obtained and reviewed including EMS report   Lab Tests:  I Ordered, and personally interpreted labs.  The pertinent results include:  cbc with wbc elevated at 12.7, bmp with bun 24 and cr 1.65; lactic 7.1, covid/flu/rsv neg, bnp 1449, trop elevated at 19,989   Imaging Studies ordered:  I ordered imaging studies including cxr  I independently visualized and interpreted imaging which showed  Bilateral airspace opacities most pronounced in the bilateral  perihilar regions and left lung base, which may represent pulmonary  edema versus multifocal pneumonia.   I agree with the radiologist interpretation   Cardiac Monitoring:  The patient was maintained on a cardiac monitor.  I personally viewed and interpreted the cardiac monitored which showed an underlying rhythm of: st   Medicines ordered and prescription drug management:  I ordered medication including rocephin/zithromax  for pna  Reevaluation of the patient after these medicines showed that the patient improved I have reviewed the patients home medicines and have made adjustments as needed   Test Considered:  ct   Critical Interventions:  Abx   Problem List / ED Course:  Resp failure:  pt on bipap.  Code sepsis called due to fever and vitals.  Pt given abx and sepsis fluids.  He was initially improving.  However, he suddenly stopped breathing and lost pulse.  His wife said she did not want CPR or intubation if it would not change his outcome.  Nurses did start CPR briefly, but that was stopped after I spoke with her. Pt became asystolic shortly after this.   Pt likely died of sepsis with a NSTEMI.  Pt's PCP's (Dr.  Ross with [facility]) office notified.  TOD 1236.   Pt's wife at bedside at time of passing. Multifocal pna:  rocephin/zithromax started.  Code sepsis called. Elevated trop:  no STEMI, but likely NSTEMI due to stress of sob.  I put in a consult to cards, but pt passed before it was called out, so this consult was canceled.   Reevaluation:  After the interventions noted above, I reevaluated the patient and found that they have : worsened   Social Determinants of Health:  Lives at home   Dispostion:  [place] Performed by: Isla Pence   Total critical care time: 30 minutes  Critical care time was exclusive of separately billable procedures and treating other patients.  Critical care was necessary to treat or prevent imminent or life-threatening deterioration.  Critical care was time spent personally by me on the following activities: development of treatment plan with patient and/or surrogate as well as nursing, discussions with consultants, evaluation of patient's response to treatment, examination of patient, obtaining history from patient or surrogate, ordering and performing treatments and interventions, ordering and review of laboratory studies, ordering and review of radiographic studies, pulse oximetry and re-evaluation of patient's condition.         Final Clinical Impression(s) / ED Diagnoses Final diagnoses:  Acute respiratory failure with hypoxia (HCC)  Sepsis, due to unspecified organism, unspecified whether acute organ dysfunction present [facility])  NSTEMI (non-ST elevated myocardial infarction) [facility])    Rx / [place] Orders ED Discharge Orders     None         Isla Pence, MD 07/06/2022 1258

## 2022-06-30 NOTE — Sepsis Progress Note (Addendum)
Code Sepsis protocol being monitored by eLink. 

## 2022-06-30 NOTE — ED Notes (Signed)
Md notified of lactic level.

## 2022-06-30 NOTE — ED Triage Notes (Signed)
PT BIBGEMS for sudden onset Sob with cold for 3 days with cough. CPAP o2 84%. Pt reports decreased work of breathing.   100 NS  84 % RA  15 Non re-breather  Rales all lobes  123456 hr [id number] systolic 40 rr Co2 [id number] CBG  125 Solumederol

## 2022-06-30 DEATH — deceased

## 2022-07-21 ENCOUNTER — Ambulatory Visit: Payer: Medicare Other | Admitting: Internal Medicine

## 2022-08-04 ENCOUNTER — Ambulatory Visit: Payer: Medicare Other | Admitting: Internal Medicine
